# Patient Record
Sex: Female | Born: 1995 | Race: White | Hispanic: No | Marital: Married | Smoking: Never smoker
Health system: Southern US, Community
[De-identification: ages and names within clinical notes are randomized; demographics above are authoritative.]

## PROBLEM LIST (undated history)

## (undated) DIAGNOSIS — L709 Acne, unspecified: Secondary | ICD-10-CM

## (undated) DIAGNOSIS — I10 Essential (primary) hypertension: Secondary | ICD-10-CM

## (undated) DIAGNOSIS — Z789 Other specified health status: Secondary | ICD-10-CM

## (undated) HISTORY — PX: APPENDECTOMY: SHX54

## (undated) HISTORY — PX: NO PAST SURGERIES: SHX2092

## (undated) HISTORY — DX: Essential (primary) hypertension: I10

---
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
## 2016-04-17 ENCOUNTER — Encounter: Payer: Self-pay | Admitting: Emergency Medicine

## 2016-04-17 ENCOUNTER — Ambulatory Visit
Admission: EM | Admit: 2016-04-17 | Discharge: 2016-04-17 | Disposition: A | Discharge status: Home / Self Care | Payer: BLUE CROSS/BLUE SHIELD | Attending: Emergency Medicine | Admitting: Emergency Medicine

## 2016-04-17 DIAGNOSIS — M62830 Muscle spasm of back: Secondary | ICD-10-CM | POA: Diagnosis not present

## 2016-04-17 DIAGNOSIS — S29012A Strain of muscle and tendon of back wall of thorax, initial encounter: Secondary | ICD-10-CM | POA: Diagnosis not present

## 2016-04-17 DIAGNOSIS — S46811A Strain of other muscles, fascia and tendons at shoulder and upper arm level, right arm, initial encounter: Secondary | ICD-10-CM

## 2016-04-17 MED ORDER — METAXALONE 800 MG PO TABS: 800.0000 mg | ORAL_TABLET | Freq: Three times a day (TID) | ORAL | Status: DC

## 2016-04-17 MED ORDER — DICLOFENAC SODIUM 75 MG PO TBEC: 75.0000 mg | DELAYED_RELEASE_TABLET | Freq: Two times a day (BID) | ORAL | Status: DC

## 2016-04-17 MED ORDER — TRAMADOL HCL 50 MG PO TABS: ORAL_TABLET | ORAL | Status: DC

## 2016-04-17 NOTE — ED Provider Notes (Signed)
HPI  SUBJECTIVE:  Gabrielle Fernandez is a 20 y.o. female who presents with 1-1/2 weeks of right upper back pain that she describes as constant, sharp, sore. It is located between her right scapula, spine and trapezius area. She denies any radiation to his pain but reports some numbness in her right arm if she holds it up. It resolves on its own when she lowers her arm. She tried 2 tabs of Tylenol once a day for 3 days. She has not taken any NSAIDs. She is currently not on any narcotics. There are no alleviating factors. Symptoms worse with pulling, lifting her arm, and lifting heavy objects, lying down, torso rotation. She is right-handed. 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 She reports a lot of heavy lifting, repetitive motion. She states that she is working more recently. She denies any direct trauma to her arm, back, shoulder. No fevers, coughing, wheezing, chest pain, shortness of breath. No shoulder joint pain. No bruising, erythema, swelling. No arm weakness. No neck pain. No abdominal pain. She has never had symptoms like this before. Past medical history negative for diabetes, hypertension, asthma, emphysema, COPD, cancer, osteoporosis, prolonged steroid use, previous back injury, shoulder injury, IV drug use, coronary artery disease, MI, pneumothorax. LMP: 3 weeks ago, she denies possibility of being pregnant, states we do not need to check. PMD: Dr. Quillian QuinceBliss.    History reviewed. No pertinent past medical history.  History reviewed. No pertinent past surgical history.  History reviewed. No pertinent family history.  Social History  Substance Use Topics  . Smoking status: Never Smoker   . Smokeless tobacco: None  . Alcohol Use: No    No current facility-administered medications for this encounter.  Current outpatient prescriptions:  .  diclofenac (VOLTAREN) 75 MG EC tablet, Take 1 tablet (75 mg total) by mouth 2 (two) times daily.  Take with food, Disp: 30 tablet, Rfl: 0 .  metaxalone (SKELAXIN) 800 MG tablet, Take 1 tablet (800 mg total) by mouth 3 (three) times daily., Disp: 21 tablet, Rfl: 0 .  traMADol (ULTRAM) 50 MG tablet, 1-2 tabs po q 6 hr prn pain Maximum dose= 8 tablets per day, Disp: 20 tablet, Rfl: 0  No Known Allergies   ROS  As noted in HPI.   Physical Exam  BP 128/86 mmHg  Pulse 85  Temp(Src) 98.7 F (37.1 C) (Oral)  Resp 16  Ht 5\' 6"  (1.676 m)  Wt 130 lb (58.968 kg)  BMI 20.99 kg/m2  SpO2 100%  LMP 03/27/2016 (Approximate)  Constitutional: Well developed, well nourished, no acute distress Eyes:  EOMI, conjunctiva normal bilaterally HENT: Normocephalic, atraumatic,mucus membranes moist Respiratory: Normal inspiratory effort , Lungs clear bilaterally, good air movement Cardiovascular: Normal rate, regular rhythm, no murmurs, rubs, gallops GI: nondistended.  skin: No rash , Bruising, color changes on her back, skin intact Musculoskeletal: Positive tenderness along the right trapezius and rhomboid muscles. Positive  muscle spasm. Symptoms aggravated with arm movement, torso rotation, rowing motion. No shoulder joint tenderness. No bony tenderness. No tenderness along her right upper extremity. Back pain is aggravated with abduction, forward flexion, lift off test. RP 2+ equal bilaterally Spine: No C-spine, T-spine, L-spine tenderness. No paralumbar tenderness, muscle spasm. Neurologic: Alert & oriented x 3, no focal neuro deficits. Arm, shoulder, grip strength equal bilaterally. Sensation grossly intact. Psychiatric: Speech and behavior appropriate   ED Course   Medications - No data to display  No  orders of the defined types were placed in this encounter.    No results found for this or any previous visit (from the past 24 hour(s)). No results found.  ED Clinical Impression  Rhomboid muscle strain, initial encounter  Back muscle spasm  Trapezius strain, right, initial  encounter   ED Assessment/Plan  [place] narcotic database reviewed. Pt with no narcotic rx in the past 6 months.   Presentation most consistent with trapezius/rhomboid strain/sprain with some muscle spasm. Doubt bony injury, or spinal cord involvement, deferring imaging. She has no evidence of neurovascular compromise. She denies any neck pain, unsure as to the etiology of her paresthesias when she raises her arm. there may be some shoulder impingement, however, it seems to be primarily muscular strain and spasm. We'll send home with regular NSAIDs, diclofenac 75 mg twice a day, muscle relaxant, tramadol, advised deep tissue massage. We'll write work 2 day work note. Follow-up with PMD as needed. Discussed signs and symptoms that should prompt return to emergency room. Patient agrees with plan.  *This clinic note was created using Dragon dictation software. Therefore, there may be occasional mistakes despite careful proofreading.  ?    Domenick Gong, MD 04/17/16 2052

## 2016-04-17 NOTE — ED Notes (Signed)
Patient c/o upper right sided back pain for the past week.  Patient reports that she does some lifting at work.

## 2016-08-31 ENCOUNTER — Encounter: Payer: Self-pay | Admitting: Emergency Medicine

## 2016-08-31 DIAGNOSIS — N132 Hydronephrosis with renal and ureteral calculous obstruction: Secondary | ICD-10-CM | POA: Insufficient documentation

## 2016-08-31 DIAGNOSIS — K353 Acute appendicitis with localized peritonitis: Principal | ICD-10-CM | POA: Insufficient documentation

## 2016-08-31 LAB — POCT PREGNANCY, URINE: Preg Test, Ur: NEGATIVE

## 2016-08-31 NOTE — ED Triage Notes (Signed)
Pt presents to ED with sudden onset of right lower abd pain. Pt states her abd pain initially started as more generalized and more recently has moved to her right lower quadrant. Pt bent over during triage with her hand over her abd. Pt alert and answering questions without difficulty. Pt reports pain increases with movement. +nasuea.

## 2016-09-01 ENCOUNTER — Observation Stay: Payer: BLUE CROSS/BLUE SHIELD | Admitting: Anesthesiology

## 2016-09-01 ENCOUNTER — Observation Stay
Admission: EM | Admit: 2016-09-01 | Discharge: 2016-09-02 | Disposition: A | Discharge status: Home / Self Care | Payer: BLUE CROSS/BLUE SHIELD | Attending: Surgery | Admitting: Surgery

## 2016-09-01 ENCOUNTER — Emergency Department: Payer: BLUE CROSS/BLUE SHIELD

## 2016-09-01 ENCOUNTER — Encounter
Admission: EM | Disposition: A | Discharge status: Home / Self Care | Payer: Self-pay | Source: Home / Self Care | Attending: Emergency Medicine

## 2016-09-01 DIAGNOSIS — R109 Unspecified abdominal pain: Secondary | ICD-10-CM

## 2016-09-01 DIAGNOSIS — K353 Acute appendicitis with localized peritonitis, without perforation or gangrene: Secondary | ICD-10-CM

## 2016-09-01 DIAGNOSIS — K3580 Unspecified acute appendicitis: Secondary | ICD-10-CM | POA: Diagnosis present

## 2016-09-01 HISTORY — PX: LAPAROSCOPIC APPENDECTOMY: SHX408

## 2016-09-01 HISTORY — DX: Other specified health status: Z78.9

## 2016-09-01 LAB — COMPREHENSIVE METABOLIC PANEL
ALBUMIN: 4.5 g/dL (ref 3.5–5.0)
ALK PHOS: 34 U/L — AB (ref 38–126)
ALT: 18 U/L (ref 14–54)
ANION GAP: 6 (ref 5–15)
AST: 18 U/L (ref 15–41)
BILIRUBIN TOTAL: 0.4 mg/dL (ref 0.3–1.2)
BUN: 12 mg/dL (ref 6–20)
CALCIUM: 9.9 mg/dL (ref 8.9–10.3)
CO2: 26 mmol/L (ref 22–32)
Chloride: 106 mmol/L (ref 101–111)
Creatinine, Ser: 0.64 mg/dL (ref 0.44–1.00)
GFR calc Af Amer: >60 mL/min (ref >60)
GLUCOSE: 84 mg/dL (ref 65–99)
POTASSIUM: 3.5 mmol/L (ref 3.5–5.1)
Sodium: 138 mmol/L (ref 135–145)
TOTAL PROTEIN: 7.6 g/dL (ref 6.5–8.1)

## 2016-09-01 LAB — URINALYSIS COMPLETE WITH MICROSCOPIC (ARMC ONLY)
BILIRUBIN URINE: NEGATIVE
Bacteria, UA: NONE SEEN
GLUCOSE, UA: NEGATIVE mg/dL
Ketones, ur: NEGATIVE mg/dL
LEUKOCYTES UA: NEGATIVE
NITRITE: NEGATIVE
PH: 6 (ref 5.0–8.0)
Protein, ur: NEGATIVE mg/dL
SPECIFIC GRAVITY, URINE: 1.009 (ref 1.005–1.030)

## 2016-09-01 LAB — CBC
HEMATOCRIT: 40.3 % (ref 35.0–47.0)
HEMOGLOBIN: 14.1 g/dL (ref 12.0–16.0)
MCH: 31.1 pg (ref 26.0–34.0)
MCHC: 34.9 g/dL (ref 32.0–36.0)
MCV: 89 fL (ref 80.0–100.0)
Platelets: 190 10*3/uL (ref 150–440)
RBC: 4.53 MIL/uL (ref 3.80–5.20)
RDW: 12.2 % (ref 11.5–14.5)
WBC: 9.2 10*3/uL (ref 3.6–11.0)

## 2016-09-01 SURGERY — APPENDECTOMY, LAPAROSCOPIC
Anesthesia: General | Site: Abdomen | Wound class: Clean Contaminated

## 2016-09-01 MED ORDER — ONDANSETRON HCL 4 MG/2ML IJ SOLN
4.0000 mg | Freq: Four times a day (QID) | INTRAMUSCULAR | Status: DC | PRN
  Administered 2016-09-01 (×2): 4 mg via INTRAVENOUS
  Filled 2016-09-01: qty 2

## 2016-09-01 MED ORDER — FENTANYL CITRATE (PF) 100 MCG/2ML IJ SOLN
INTRAMUSCULAR | Status: DC | PRN
Start: 2016-09-01 — End: 2016-09-01
  Administered 2016-09-01 (×4): 50 ug via INTRAVENOUS

## 2016-09-01 MED ORDER — ROCURONIUM BROMIDE 100 MG/10ML IV SOLN
INTRAVENOUS | Status: DC | PRN
  Administered 2016-09-01: 40 mg via INTRAVENOUS

## 2016-09-01 MED ORDER — OXYCODONE HCL 5 MG PO TABS: 5.0000 mg | ORAL_TABLET | Freq: Once | ORAL | Status: DC | PRN

## 2016-09-01 MED ORDER — ACETAMINOPHEN 500 MG PO TABS
1000.0000 mg | ORAL_TABLET | Freq: Four times a day (QID) | ORAL | Status: DC
  Administered 2016-09-01 – 2016-09-02 (×5): 1000 mg via ORAL
  Filled 2016-09-01 (×6): qty 2

## 2016-09-01 MED ORDER — PROMETHAZINE HCL 25 MG/ML IJ SOLN: 6.2500 mg | INTRAMUSCULAR | Status: DC | PRN

## 2016-09-01 MED ORDER — FENTANYL CITRATE (PF) 100 MCG/2ML IJ SOLN: 25.0000 ug | INTRAMUSCULAR | Status: DC | PRN

## 2016-09-01 MED ORDER — POLYETHYLENE GLYCOL 3350 17 G PO PACK
17.0000 g | PACK | Freq: Every day | ORAL | Status: DC | PRN
  Filled 2016-09-01: qty 1

## 2016-09-01 MED ORDER — MEPERIDINE HCL 25 MG/ML IJ SOLN: 6.2500 mg | INTRAMUSCULAR | Status: DC | PRN

## 2016-09-01 MED ORDER — PIPERACILLIN-TAZOBACTAM 3.375 G IVPB
3.3750 g | Freq: Three times a day (TID) | INTRAVENOUS | Status: DC
  Administered 2016-09-01: 3.375 g via INTRAVENOUS
  Filled 2016-09-01 (×4): qty 50

## 2016-09-01 MED ORDER — PIPERACILLIN-TAZOBACTAM 3.375 G IVPB: 3.3750 g | Freq: Three times a day (TID) | INTRAVENOUS | Status: DC

## 2016-09-01 MED ORDER — MORPHINE SULFATE (PF) 4 MG/ML IV SOLN
4.0000 mg | Freq: Once | INTRAVENOUS | Status: AC
  Administered 2016-09-01: 4 mg via INTRAVENOUS
  Filled 2016-09-01: qty 1

## 2016-09-01 MED ORDER — BUPIVACAINE-EPINEPHRINE (PF) 0.5% -1:200000 IJ SOLN
INTRAMUSCULAR | Status: DC | PRN
  Administered 2016-09-01: 30 mL via PERINEURAL

## 2016-09-01 MED ORDER — SUGAMMADEX SODIUM 200 MG/2ML IV SOLN
INTRAVENOUS | Status: DC | PRN
  Administered 2016-09-01: 127 mg via INTRAVENOUS

## 2016-09-01 MED ORDER — KETOROLAC TROMETHAMINE 30 MG/ML IJ SOLN
30.0000 mg | Freq: Four times a day (QID) | INTRAMUSCULAR | Status: DC
  Administered 2016-09-01 – 2016-09-02 (×3): 30 mg via INTRAVENOUS
  Filled 2016-09-01 (×3): qty 1

## 2016-09-01 MED ORDER — ONDANSETRON HCL 4 MG/2ML IJ SOLN
4.0000 mg | Freq: Once | INTRAMUSCULAR | Status: AC
  Administered 2016-09-01: 4 mg via INTRAVENOUS
  Filled 2016-09-01: qty 2

## 2016-09-01 MED ORDER — LACTATED RINGERS IV SOLN
INTRAVENOUS | Status: DC | PRN
  Administered 2016-09-01: 15:00:00 via INTRAVENOUS

## 2016-09-01 MED ORDER — DEXAMETHASONE SODIUM PHOSPHATE 10 MG/ML IJ SOLN
INTRAMUSCULAR | Status: DC | PRN
  Administered 2016-09-01: 8 mg via INTRAVENOUS

## 2016-09-01 MED ORDER — BUPIVACAINE-EPINEPHRINE (PF) 0.5% -1:200000 IJ SOLN
INTRAMUSCULAR | Status: AC
  Filled 2016-09-01: qty 30

## 2016-09-01 MED ORDER — IOPAMIDOL (ISOVUE-300) INJECTION 61%
100.0000 mL | Freq: Once | INTRAVENOUS | Status: AC | PRN
  Administered 2016-09-01: 100 mL via INTRAVENOUS

## 2016-09-01 MED ORDER — SODIUM CHLORIDE 0.9 % IV BOLUS (SEPSIS)
1000.0000 mL | Freq: Once | INTRAVENOUS | Status: AC
  Administered 2016-09-01: 1000 mL via INTRAVENOUS

## 2016-09-01 MED ORDER — HYDROMORPHONE HCL 1 MG/ML IJ SOLN
0.5000 mg | INTRAMUSCULAR | Status: DC | PRN
  Administered 2016-09-01: 0.5 mg via INTRAVENOUS
  Filled 2016-09-01: qty 1

## 2016-09-01 MED ORDER — KETOROLAC TROMETHAMINE 30 MG/ML IJ SOLN
30.0000 mg | Freq: Four times a day (QID) | INTRAMUSCULAR | Status: DC
  Administered 2016-09-01 (×2): 30 mg via INTRAVENOUS
  Filled 2016-09-01: qty 1

## 2016-09-01 MED ORDER — IOPAMIDOL (ISOVUE-300) INJECTION 61%
30.0000 mL | Freq: Once | INTRAVENOUS | Status: AC | PRN
  Administered 2016-09-01: 30 mL via ORAL

## 2016-09-01 MED ORDER — ONDANSETRON 4 MG PO TBDP
4.0000 mg | ORAL_TABLET | Freq: Four times a day (QID) | ORAL | Status: DC | PRN
Start: 2016-09-01 — End: 2016-09-02
  Administered 2016-09-01 – 2016-09-02 (×2): 4 mg via ORAL
  Filled 2016-09-01 (×3): qty 1

## 2016-09-01 MED ORDER — OXYCODONE HCL 5 MG/5ML PO SOLN: 5.0000 mg | Freq: Once | ORAL | Status: DC | PRN

## 2016-09-01 MED ORDER — MIDAZOLAM HCL 2 MG/2ML IJ SOLN
INTRAMUSCULAR | Status: DC | PRN
  Administered 2016-09-01: 2 mg via INTRAVENOUS

## 2016-09-01 MED ORDER — LIDOCAINE HCL (CARDIAC) 20 MG/ML IV SOLN
INTRAVENOUS | Status: DC | PRN
  Administered 2016-09-01: 40 mg via INTRAVENOUS

## 2016-09-01 MED ORDER — SODIUM CHLORIDE 0.9 % IV SOLN
INTRAVENOUS | Status: DC
  Administered 2016-09-01 (×2): via INTRAVENOUS

## 2016-09-01 MED ORDER — PROPOFOL 10 MG/ML IV BOLUS
INTRAVENOUS | Status: DC | PRN
  Administered 2016-09-01: 130 mg via INTRAVENOUS

## 2016-09-01 MED ORDER — KETOROLAC TROMETHAMINE 30 MG/ML IJ SOLN
INTRAMUSCULAR | Status: AC
  Filled 2016-09-01: qty 1

## 2016-09-01 MED ORDER — OXYCODONE HCL 5 MG PO TABS
5.0000 mg | ORAL_TABLET | ORAL | Status: DC | PRN
  Administered 2016-09-01 (×2): 5 mg via ORAL
  Administered 2016-09-02 (×2): 10 mg via ORAL
  Filled 2016-09-01 (×2): qty 1
  Filled 2016-09-01 (×2): qty 2

## 2016-09-01 MED ORDER — PANTOPRAZOLE SODIUM 40 MG PO TBEC
40.0000 mg | DELAYED_RELEASE_TABLET | Freq: Every day | ORAL | Status: DC
  Administered 2016-09-01 – 2016-09-02 (×2): 40 mg via ORAL
  Filled 2016-09-01 (×2): qty 1

## 2016-09-01 SURGICAL SUPPLY — 41 items
CANISTER SUCT 1200ML W/VALVE (MISCELLANEOUS) ×3 IMPLANT
CHLORAPREP W/TINT 26ML (MISCELLANEOUS) ×3 IMPLANT
CUTTER FLEX LINEAR 45M (STAPLE) ×3 IMPLANT
DERMABOND ADVANCED (GAUZE/BANDAGES/DRESSINGS) ×2
DERMABOND ADVANCED .7 DNX12 (GAUZE/BANDAGES/DRESSINGS) ×1 IMPLANT
ELECT CAUTERY BLADE 6.4 (BLADE) ×3 IMPLANT
ELECT REM PT RETURN 9FT ADLT (ELECTROSURGICAL) ×3
ELECTRODE REM PT RTRN 9FT ADLT (ELECTROSURGICAL) ×1 IMPLANT
ENDOPOUCH RETRIEVER 10 (MISCELLANEOUS) ×3 IMPLANT
GLOVE SURG SYN 7.0 (GLOVE) ×3 IMPLANT
GLOVE SURG SYN 7.5  E (GLOVE) ×2
GLOVE SURG SYN 7.5 E (GLOVE) ×1 IMPLANT
GOWN STRL REUS W/ TWL LRG LVL3 (GOWN DISPOSABLE) ×2 IMPLANT
GOWN STRL REUS W/TWL LRG LVL3 (GOWN DISPOSABLE) ×4
IRRIGATION STRYKERFLOW (MISCELLANEOUS) ×1 IMPLANT
IRRIGATOR STRYKERFLOW (MISCELLANEOUS) ×3
IV NS 1000ML (IV SOLUTION) ×2
IV NS 1000ML BAXH (IV SOLUTION) ×1 IMPLANT
KIT RM TURNOVER STRD PROC AR (KITS) ×3 IMPLANT
LABEL OR SOLS (LABEL) ×3 IMPLANT
LIGASURE MARYLAND LAP STAND (ELECTROSURGICAL) ×3 IMPLANT
NDL HPO THNWL 1X22GA REG BVL (NEEDLE) ×1 IMPLANT
NEEDLE SAFETY 22GX1 (NEEDLE) ×2
NS IRRIG 500ML POUR BTL (IV SOLUTION) ×3 IMPLANT
PACK LAP CHOLECYSTECTOMY (MISCELLANEOUS) ×3 IMPLANT
PENCIL ELECTRO HAND CTR (MISCELLANEOUS) ×3 IMPLANT
RELOAD 45 VASCULAR/THIN (ENDOMECHANICALS) IMPLANT
RELOAD STAPLE TA45 3.5 REG BLU (ENDOMECHANICALS) ×3 IMPLANT
SCISSORS METZENBAUM CVD 33 (INSTRUMENTS) ×3 IMPLANT
SLEEVE ADV FIXATION 5X100MM (TROCAR) ×6 IMPLANT
SUT MNCRL 4-0 (SUTURE) ×4
SUT MNCRL 4-0 27XMFL (SUTURE) ×2
SUT VIC AB 3-0 SH 27 (SUTURE) ×2
SUT VIC AB 3-0 SH 27X BRD (SUTURE) ×1 IMPLANT
SUT VICRYL 0 AB UR-6 (SUTURE) ×3 IMPLANT
SUTURE MNCRL 4-0 27XMF (SUTURE) ×2 IMPLANT
TRAY FOLEY W/METER SILVER 16FR (SET/KITS/TRAYS/PACK) ×3 IMPLANT
TROCAR 130MM GELPORT  DAV (MISCELLANEOUS) IMPLANT
TROCAR XCEL BLUNT TIP 100MML (ENDOMECHANICALS) ×3 IMPLANT
TROCAR Z-THREAD OPTICAL 5X100M (TROCAR) ×3 IMPLANT
TUBING INSUFFLATOR HI FLOW (MISCELLANEOUS) ×3 IMPLANT

## 2016-09-01 NOTE — Anesthesia Procedure Notes (Signed)
Procedure Name: Intubation Date/Time: 09/01/2016 3:07 PM Performed by: Henrietta HooverPOPE, Lakie Mclouth Pre-anesthesia Checklist: Emergency Drugs available, Patient identified, Patient being monitored, Timeout performed and Suction available Patient Re-evaluated:Patient Re-evaluated prior to inductionOxygen Delivery Method: Circle system utilized Preoxygenation: Pre-oxygenation with 100% oxygen Intubation Type: IV induction Ventilation: Mask ventilation without difficulty Laryngoscope Size: Mac and 3 Grade View: Grade I Tube type: Oral Tube size: 7.0 mm Number of attempts: 1 Airway Equipment and Method: Stylet Placement Confirmation: ETT inserted through vocal cords under direct vision,  positive ETCO2 and breath sounds checked- equal and bilateral Secured at: 22 cm Tube secured with: Tape Dental Injury: Teeth and Oropharynx as per pre-operative assessment

## 2016-09-01 NOTE — Anesthesia Postprocedure Evaluation (Signed)
Anesthesia Post Note  Patient: Gabrielle Fernandez  Procedure(s) Performed: Procedure(s) (LRB): APPENDECTOMY LAPAROSCOPIC (N/A)  Patient location during evaluation: PACU Anesthesia Type: General Level of consciousness: awake and alert Pain management: pain level controlled Vital Signs Assessment: post-procedure vital signs reviewed and stable Respiratory status: spontaneous breathing and respiratory function stable Cardiovascular status: blood pressure returned to baseline and stable Anesthetic complications: no    Last Vitals:  Vitals:   09/01/16 1629 09/01/16 1643  BP: (!) 114/58 (!) 109/54  Pulse: 88 79  Resp: 19 16  Temp: 36.6 C     Last Pain:  Vitals:   09/01/16 1643  TempSrc:   PainSc: 0-No pain                 Erielle Gawronski K

## 2016-09-01 NOTE — H&P (Signed)
Date of Admission:  09/01/2016  Reason for Admission:  Acute appendicitis  History of Present Illness: Gabrielle Fernandez is a 20 y.o. female who presents with a one-day history of abdominal pain. Patient reports her pain started yesterday at around noontime in the mid abdominal region but has now radiated towards the right lower quadrant and is sharp and has worsened in severity. Patient denies any fevers but does report having chills at home. She denies having chest pain or shortness of breath. She does report having nausea and episodes of emesis this morning. Denies other areas of abdominal pain. Denies constipation or diarrhea. Denies any hematuria or dysuria.  Past Medical History: History reviewed. No pertinent past medical history.   Past Surgical History: History reviewed. No pertinent surgical history.  Home Medications: Prior to Admission medications   Medication Sig Start Date End Date Taking? Authorizing Provider  diclofenac (VOLTAREN) 75 MG EC tablet Take 1 tablet (75 mg total) by mouth 2 (two) times daily. Take with food Patient not taking: Reported on 09/01/2016 04/17/16   Domenick Gong, MD  metaxalone (SKELAXIN) 800 MG tablet Take 1 tablet (800 mg total) by mouth 3 (three) times daily. Patient not taking: Reported on 09/01/2016 04/17/16   Domenick Gong, MD    Allergies: No Known Allergies  Social History:  reports that she has never smoked. She has never used smokeless tobacco. She reports that she drinks alcohol. Her drug history is not on file.   Family History: No family history on file.  Review of Systems: Review of Systems  Constitutional: Positive for chills. Negative for fever.  HENT: Negative for hearing loss.   Eyes: Negative for blurred vision.  Respiratory: Negative for cough and shortness of breath.   Cardiovascular: Negative for chest pain and leg swelling.  Gastrointestinal: Positive for abdominal pain, nausea and vomiting. Negative for blood  in stool, constipation, diarrhea and heartburn.  Genitourinary: Negative for dysuria and hematuria.  Musculoskeletal: Negative for myalgias.  Skin: Negative for rash.  Neurological: Negative for dizziness.  Psychiatric/Behavioral: Negative for depression.  All other systems reviewed and are negative.   Physical Exam BP (!) 91/57 (BP Location: Left Arm)   Pulse 87   Temp 99 F (37.2 C) (Oral)   Resp 20   Ht 5\' 7"  (1.702 m)   Wt 63.5 kg (140 lb)   LMP 08/06/2016 (Within Days)   SpO2 100%   BMI 21.93 kg/m  CONSTITUTIONAL: No acute distress HEENT:  Normocephalic, atraumatic, extraocular motion intact. NECK: Trachea is midline, and there is no jugular venous distension. RESPIRATORY:  Lungs are clear, and breath sounds are equal bilaterally. Normal respiratory effort without pathologic use of accessory muscles. CARDIOVASCULAR: Heart is regular without murmurs, gallops, or rubs. GI: The abdomen is soft, nondistended, tender to palpation in the right lower quadrant at McBurney's point. Positive Rovsing sign. There were no palpable masses.  MUSCULOSKELETAL:  Normal muscle strength and tone in all four extremities.  No peripheral edema or cyanosis. SKIN: Skin turgor is normal. There are no pathologic skin lesions.  NEUROLOGIC:  Motor and sensation is grossly normal.  Cranial nerves are grossly intact. PSYCH:  Alert and oriented to person, place and time. Affect is normal.  Laboratory Analysis: Results for orders placed or performed during the hospital encounter of 09/01/16 (from the past 24 hour(s))  Comprehensive metabolic panel     Status: Abnormal   Collection Time: 08/31/16 11:37 PM  Result Value Ref Range   Sodium 138 135 - 145  mmol/L   Potassium 3.5 3.5 - 5.1 mmol/L   Chloride 106 101 - 111 mmol/L   CO2 26 22 - 32 mmol/L   Glucose, Bld 84 65 - 99 mg/dL   BUN 12 6 - 20 mg/dL   Creatinine, Ser 1.610.64 0.44 - 1.00 mg/dL   Calcium 9.9 8.9 - 09.610.3 mg/dL   Total Protein 7.6 6.5 - 8.1  g/dL   Albumin 4.5 3.5 - 5.0 g/dL   AST 18 15 - 41 U/L   ALT 18 14 - 54 U/L   Alkaline Phosphatase 34 (L) 38 - 126 U/L   Total Bilirubin 0.4 0.3 - 1.2 mg/dL   GFR calc non Af Amer >60 >60 mL/min   GFR calc Af Amer >60 >60 mL/min   Anion gap 6 5 - 15  CBC     Status: None   Collection Time: 08/31/16 11:37 PM  Result Value Ref Range   WBC 9.2 3.6 - 11.0 K/uL   RBC 4.53 3.80 - 5.20 MIL/uL   Hemoglobin 14.1 12.0 - 16.0 g/dL   HCT 04.540.3 40.935.0 - 81.147.0 %   MCV 89.0 80.0 - 100.0 fL   MCH 31.1 26.0 - 34.0 pg   MCHC 34.9 32.0 - 36.0 g/dL   RDW 91.412.2 78.211.5 - 95.614.5 %   Platelets 190 150 - 440 K/uL  Urinalysis complete, with microscopic     Status: Abnormal   Collection Time: 08/31/16 11:37 PM  Result Value Ref Range   Color, Urine STRAW (A) YELLOW   APPearance HAZY (A) CLEAR   Glucose, UA NEGATIVE NEGATIVE mg/dL   Bilirubin Urine NEGATIVE NEGATIVE   Ketones, ur NEGATIVE NEGATIVE mg/dL   Specific Gravity, Urine 1.009 1.005 - 1.030   Hgb urine dipstick 1+ (A) NEGATIVE   pH 6.0 5.0 - 8.0   Protein, ur NEGATIVE NEGATIVE mg/dL   Nitrite NEGATIVE NEGATIVE   Leukocytes, UA NEGATIVE NEGATIVE   RBC / HPF 0-5 0 - 5 RBC/hpf   WBC, UA 0-5 0 - 5 WBC/hpf   Bacteria, UA NONE SEEN NONE SEEN   Squamous Epithelial / LPF 0-5 (A) NONE SEEN  Pregnancy, urine POC     Status: None   Collection Time: 08/31/16 11:42 PM  Result Value Ref Range   Preg Test, Ur NEGATIVE NEGATIVE    Imaging: [place] Pelvis Complete  Result Date: 09/01/2016 CLINICAL DATA:  Right lower quadrant pain. History of ovarian cysts. EXAM: TRANSABDOMINAL ULTRASOUND OF PELVIS DOPPLER ULTRASOUND OF OVARIES TECHNIQUE: Transabdominal ultrasound examination of the pelvis was performed including evaluation of the uterus, ovaries, adnexal regions, and pelvic cul-de-sac. Color and duplex Doppler ultrasound was utilized to evaluate blood flow to the ovaries. COMPARISON:  Ultrasound pelvis 12/26/2012 FINDINGS: Uterus Measurements: 5.9 x 3.7 x 4.1 cm.  Uterus is anteverted. No fibroids or other mass visualized. Endometrium Thickness: 8.6 mm. No focal abnormality visualized. Right ovary Measurements: 2.5 x 2 x 2.4 cm. Normal appearance/no adnexal mass. Left ovary Measurements: 3.6 x 2.5 x 2.6 cm. Normal appearance/no adnexal mass. Pulsed Doppler evaluation demonstrates normal low-resistance arterial and venous waveforms in both ovaries. Flow is demonstrated in both ovaries on color flow Doppler imaging. No free fluid in the pelvis. IMPRESSION: Normal ultrasound appearance of the uterus and ovaries. No evidence of adnexal mass or ovarian torsion. Electronically Signed   By: Burman NievesWilliam  Stevens M.D.   On: 09/01/2016 05:58   Ct Abdomen Pelvis W Contrast  Result Date: 09/01/2016 CLINICAL DATA:  Sudden onset right lower abdominal pain. Pain  increases with movement. Nausea. EXAM: CT ABDOMEN AND PELVIS WITH CONTRAST TECHNIQUE: Multidetector CT imaging of the abdomen and pelvis was performed using the standard protocol following bolus administration of intravenous contrast. CONTRAST:  ISOVUE-300 IOPAMIDOL (ISOVUE-300) INJECTION 61% COMPARISON:  Ultrasound pelvis 09/01/2016 FINDINGS: Lower chest: Lung bases are clear. Hepatobiliary: No focal liver abnormality is seen. No gallstones, gallbladder wall thickening, or biliary dilatation. Pancreas: Unremarkable. No pancreatic ductal dilatation or surrounding inflammatory changes. Spleen: Normal in size without focal abnormality. Adrenals/Urinary Tract: Adrenal glands are unremarkable. 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 Mild left ureterectasis may indicate reflux. No hydronephrosis. Punctate size nonobstructing stone in a lower pole calyx on the left. No ureteral stones identified. Right kidney and ureter are unremarkable in appearance. Bladder is unremarkable. Stomach/Bowel: Stomach, small bowel, and colon are not abnormally distended. No wall thickening is identified. Scattered stool throughout the colon.  The appendix retrocecal in location and is distended with diameter measuring 13 mm. Periappendiceal edema and stranding. Changes are consistent with acute appendicitis. No abscess. Small amount of free fluid in the pelvis could be reactive or physiologic. Vascular/Lymphatic: No significant vascular findings are present. No enlarged abdominal or pelvic lymph nodes. Reproductive: Uterus and ovaries are not enlarged. Left fallopian tubes are somewhat prominent and fluid-filled suggesting mild hydrosalpinx. Prominent pelvic venous structures may indicate pelvic congestion. Involuting follicle demonstrated on the left ovary. Other: No free air in the abdomen. Abdominal wall musculature appears intact. Musculoskeletal: No acute or significant osseous findings. IMPRESSION: The retrocecal appendix is dilated with periappendiceal edema and fluid consistent with acute appendicitis. No abscess. Involuting follicle in the left ovary. Small amount of free fluid in the pelvis may be physiologic or reactive. Prominent pelvic veins may indicate pelvic congestion. 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 Mild ureteral dilatation on the left may indicate reflux. No hydronephrosis. Nonobstructing stone in the left kidney. Electronically Signed   By: Burman Nieves M.D.   On: 09/01/2016 06:39   [place] Art/ven Flow Abd Pelv Doppler  Result Date: 09/01/2016 CLINICAL DATA:  Right lower quadrant pain. History of ovarian cysts. EXAM: TRANSABDOMINAL ULTRASOUND OF PELVIS DOPPLER ULTRASOUND OF OVARIES TECHNIQUE: Transabdominal ultrasound examination of the pelvis was performed including evaluation of the uterus, ovaries, adnexal regions, and pelvic cul-de-sac. Color and duplex Doppler ultrasound was utilized to evaluate blood flow to the ovaries. COMPARISON:  Ultrasound pelvis 12/26/2012 FINDINGS: Uterus Measurements: 5.9 x 3.7 x 4.1 cm. Uterus is anteverted. No fibroids or other mass visualized. Endometrium Thickness: 8.6 mm. No  focal abnormality visualized. Right ovary Measurements: 2.5 x 2 x 2.4 cm. Normal appearance/no adnexal mass. Left ovary Measurements: 3.6 x 2.5 x 2.6 cm. Normal appearance/no adnexal mass. Pulsed Doppler evaluation demonstrates normal low-resistance arterial and venous waveforms in both ovaries. Flow is demonstrated in both ovaries on color flow Doppler imaging. No free fluid in the pelvis. IMPRESSION: Normal ultrasound appearance of the uterus and ovaries. No evidence of adnexal mass or ovarian torsion. Electronically Signed   By: Burman Nieves M.D.   On: 09/01/2016 05:58    Assessment and Plan: This is a 20 y.o. female who presents with acute appendicitis.  Patient will be admitted to the general surgery service. We have discussed the management options including conservative management with antibiotics versus surgical management the patient has opted for appendectomy. The risks and benefits of a laparoscopic procedure have been explained to the patient including risk of bleeding, infection, and injury to surrounding structures, including the possibility for open procedure, and she  has given informed consent. The patient will be nothing by mouth with IV fluid hydration and will be started on IV antibiotics in the emergency room. She will be posted for laparoscopic appendectomy to be done today. The patient understands this plan and all of her questions and her parents' questions have been answered.   Howie IllJose Luis Sherley Mckenney, MD [facility]

## 2016-09-01 NOTE — ED Notes (Signed)
Patient at US/CT

## 2016-09-01 NOTE — Anesthesia Preprocedure Evaluation (Signed)
Anesthesia Evaluation  Patient identified by MRN, date of birth, ID band Patient awake    Reviewed: Allergy & Precautions, NPO status , Patient's Chart, lab work & pertinent test results  History of Anesthesia Complications Negative for: history of anesthetic complications  Airway Mallampati: II  TM Distance: >3 FB Neck ROM: Full    Dental no notable dental hx.    Pulmonary neg pulmonary ROS, neg sleep apnea, neg COPD,    breath sounds clear to auscultation- rhonchi (-) wheezing      Cardiovascular Exercise Tolerance: Good (-) hypertension(-) CAD and (-) Past MI  Rhythm:Regular Rate:Normal - Systolic murmurs and - Diastolic murmurs    Neuro/Psych negative neurological ROS  negative psych ROS   GI/Hepatic negative GI ROS, Neg liver ROS,   Endo/Other  negative endocrine ROSneg diabetes  Renal/GU negative Renal ROS     Musculoskeletal negative musculoskeletal ROS (+)   Abdominal (+) - obese,   Peds  Hematology negative hematology ROS (+)   Anesthesia Other Findings Acute appendicitis   Reproductive/Obstetrics                             Anesthesia Physical Anesthesia Plan  ASA: I  Anesthesia Plan: General   Post-op Pain Management:    Induction: Intravenous, Rapid sequence and Cricoid pressure planned  Airway Management Planned: Oral ETT  Additional Equipment:   Intra-op Plan:   Post-operative Plan: Extubation in OR  Informed Consent: I have reviewed the patients History and Physical, chart, labs and discussed the procedure including the risks, benefits and alternatives for the proposed anesthesia with the patient or authorized representative who has indicated his/her understanding and acceptance.   Dental advisory given  Plan Discussed with: CRNA and Anesthesiologist  Anesthesia Plan Comments:         Anesthesia Quick Evaluation

## 2016-09-01 NOTE — Transfer of Care (Signed)
Immediate Anesthesia Transfer of Care Note  Patient: Gabrielle Fernandez  Procedure(s) Performed: Procedure(s): APPENDECTOMY LAPAROSCOPIC (N/A)  Patient Location: PACU  Anesthesia Type:General  Level of Consciousness: awake  Airway & Oxygen Therapy: Patient Spontanous Breathing and Patient connected to face mask oxygen  Post-op Assessment: Report given to RN and Post -op Vital signs reviewed and stable  Post vital signs: Reviewed and stable  Last Vitals:  Vitals:   09/01/16 1306 09/01/16 1629  BP: 117/71 (!) 114/58  Pulse: 77 88  Resp: 16 19  Temp: 37 C 36.6 C    Last Pain:  Vitals:   09/01/16 1306  TempSrc: Tympanic  PainSc:          Complications: No apparent anesthesia complications

## 2016-09-01 NOTE — Op Note (Signed)
  Procedure Date:  09/01/2016  Pre-operative Diagnosis:  Acute appendicitis  Post-operative Diagnosis: Acute appendicitis  Procedure:  Laparoscopic appendectomy  Surgeon:  Howie IllJose Luis Estrella Alcaraz, MD  Anesthesia:  General endotracheal  Estimated Blood Loss:  5 ml  Specimens:  appendix  Complications:  None  Indications for Procedure:  This is a 20 y.o. female who presents with abdominal pain and workup revealing acute appendicitis.  The options of surgery versus observation were reviewed with the patient and/or family. The risks of bleeding, infection, recurrence of symptoms, negative laparoscopy, potential for an open procedure, bowel injury, abscess or infection, were all discussed with the patient and she was willing to proceed.  Description of Procedure: The patient was correctly identified in the preoperative area and brought into the operating room.  The patient was placed supine with VTE prophylaxis in place.  Appropriate time-outs were performed.  Anesthesia was induced and the patient was intubated.  Foley catheter was placed.  Appropriate antibiotics were infused.  The abdomen was prepped and draped in a sterile fashion. An infraumbilical incision was made. A cutdown technique was used to enter the abdominal cavity without injury, and a Hasson trocar was inserted.  Pneumoperitoneum was obtained with appropriate opening pressures.  Two 5-mm ports were placed in the suprapubic and left lateral positions under direct visualization.  The right lower quadrant was inspected and the appendix was identified and found to be acutely inflamed and retrocecal, but not ruptured.  The appendix was carefully dissected. The base of the appendix was dissected out and divided with a standard load Endo GIA. The mesoappendix was divided using the LigaSure.  The appendix was placed in an Endocatch bag and brought out through the umbilical incision.  The right lower quadrant was then inspected again revealing  an intact staple line, no bleeding, and no bowel injury.  The area was thoroughly irrigated.  The 5 mm ports were removed under direct visualization and the Hasson trocar was removed.  The fascial opening was closed using 0 vicryl suture.  Local anesthetic was infused in all incisions and the incisions were closed with 4-0 Monocryl.  The wounds were cleaned and sealed with DermaBond.  Foley catheter was removed and the patient was emerged from anesthesia and extubated and brought to the recovery room for further management.  The patient tolerated the procedure well and all counts were correct at the end of the case.   Howie IllJose Luis Jeffie Spivack, MD

## 2016-09-01 NOTE — ED Provider Notes (Signed)
[facility] Emergency Department Provider Note   ____________________________________________   First MD Initiated Contact with Patient 09/01/16 [phone]     (approximate)  I have reviewed the triage vital signs and the nursing notes.   HISTORY  Chief Complaint Abdominal Pain    HPI Gabrielle Fernandez is a 20 y.o. female who comes into the hospital today with abdominal pain. The patient reports that the pain started in her mid abdomen around noon today. She thought it was initially an upset stomach but she reports that the pain moved over to her right side. She reports that now the pain is sharp in her right lower quadrant. The patient rates her pain an 8 out of 10 in intensity. She's had a cyst on her ovaries when she was younger but it was not this bad. She does not remember what side it was on. The patient's last menstrual period was October 20 but she reports that she does have irregular periods. The patient denies any vaginal discharge and has never had a pelvic exam before. The patient has had some nausea and vomiting at home but denies fever. She is also had some decreased appetite. She denies pain with urination diarrhea or constipation. Given the persistence of this pain the patient decided to come into the hospital to get checked out today.   History reviewed. No pertinent past medical history.  There are no active problems to display for this patient.   History reviewed. No pertinent surgical history.  Prior to Admission medications   Medication Sig Start Date End Date Taking? Authorizing Provider  diclofenac (VOLTAREN) 75 MG EC tablet Take 1 tablet (75 mg total) by mouth 2 (two) times daily. Take with food Patient not taking: Reported on 09/01/2016 04/17/16   Domenick GongAshley Mortenson, MD  metaxalone (SKELAXIN) 800 MG tablet Take 1 tablet (800 mg total) by mouth 3 (three) times daily. Patient not taking: Reported on 09/01/2016 04/17/16   Domenick GongAshley Mortenson, MD      Allergies Patient has no known allergies.  No family history on file.  Social History Social History  Substance Use Topics  . Smoking status: Never Smoker  . Smokeless tobacco: Never Used  . Alcohol use Yes    Review of Systems Constitutional: No fever/chills Eyes: No visual changes. ENT: No sore throat. Cardiovascular: Denies chest pain. Respiratory: Denies shortness of breath. Gastrointestinal:  abdominal pain. nausea,  vomiting.  No diarrhea.  No constipation. Genitourinary: Negative for dysuria. Musculoskeletal: Negative for back pain. Skin: Negative for rash. Neurological: Negative for headaches, focal weakness or numbness.  10-point ROS otherwise negative.  ____________________________________________   PHYSICAL EXAM:  VITAL SIGNS: ED Triage Vitals  Enc Vitals Group     BP 08/31/16 2329 (!) 141/81     Pulse Rate 08/31/16 2329 (!) 117     Resp 08/31/16 2329 20     Temp 08/31/16 2329 99 F (37.2 C)     Temp Source 08/31/16 2329 Oral     SpO2 08/31/16 2329 100 %     Weight 08/31/16 2330 140 lb (63.5 kg)     Height 08/31/16 2330 5\' 7"  (1.702 m)     Head Circumference --      Peak Flow --      Pain Score 08/31/16 2331 8     Pain Loc --      Pain Edu? --      Excl. in GC? --     Constitutional: Alert and oriented. Well appearing  and in Moderate distress. Eyes: Conjunctivae are normal. PERRL. EOMI. Head: Atraumatic. Nose: No congestion/rhinnorhea. Mouth/Throat: Mucous membranes are moist.  Oropharynx non-erythematous. Cardiovascular: Normal rate, regular rhythm. Grossly normal heart sounds.  Good peripheral circulation. Respiratory: Normal respiratory effort.  No retractions. Lungs CTAB. Gastrointestinal: Soft with some right lower quadrant tenderness to palpation. No distention. Positive bowel sounds Genitourinary: Deferred due to patient's lack of previous pelvic exam Musculoskeletal: No lower extremity tenderness nor edema.   Neurologic:  Normal  speech and language.  Skin:  Skin is warm, dry and intact. Marland Kitchen. Psychiatric: Mood and affect are normal.   ____________________________________________   LABS (all labs ordered are listed, but only abnormal results are displayed)  Labs Reviewed  COMPREHENSIVE METABOLIC PANEL - Abnormal; Notable for the following:       Result Value   Alkaline Phosphatase 34 (*)    All other components within normal limits  URINALYSIS COMPLETEWITH MICROSCOPIC ([facility] ONLY) - Abnormal; Notable for the following:    Color, Urine STRAW (*)    APPearance HAZY (*)    Hgb urine dipstick 1+ (*)    Squamous Epithelial / LPF 0-5 (*)    All other components within normal limits  CBC  POC URINE PREG, ED  POCT PREGNANCY, URINE   ____________________________________________  EKG  none ____________________________________________  RADIOLOGY  CT abd and pelvis US pelvis ____________________________________________   PROCEDURES  Procedure(s) performed: None  Procedures  Critical Care performed: No  ____________________________________________   INITIAL IMPRESSION / ASSESSMENT AND PLAN / ED COURSE  Pertinent labs & imaging results that were available during my care of the patient were reviewed by me and considered in my medical decision making (see chart for details).  This is a 20 year old female who comes into the hospital today with some right lower quadrant pain. The patient's history does raise concern for appendicitis. I will send the patient for a CT scan of her abdomen and pelvis. She will receive a liter of normal saline as well as some Zofran and morphine. I will also send the patient for an ultrasound of her pelvis given her history of ovarian cysts. The patient will be reassessed once I received the results of her imaging studies.  Clinical Course as of Sep 01 752  Tue Sep 01, 2016  [id number] Normal ultrasound appearance of the uterus and ovaries. No evidence of adnexal mass or ovarian  torsion.   US Pelvis Complete [AW]  0751 The retrocecal appendix is dilated with periappendiceal edema and fluid consistent with acute appendicitis. No abscess.  Involuting follicle in the left ovary. Small amount of free fluid in the pelvis may be physiologic or reactive. Prominent pelvic veins may indicate pelvic congestion.  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 Mild ureteral dilatation on the left may indicate reflux. No hydronephrosis. Nonobstructing stone in the left kidney.   CT Abdomen Pelvis W Contrast [AW]    Clinical Course User Index [AW] Rebecka ApleyAllison P Taiesha Bovard, MD   The patient's CT scan returned showing a retrocecal appendicitis. The patient did receive a second dose of morphine for her pain. I did contact the surgeon to admit the patient for appendicitis.  ____________________________________________   FINAL CLINICAL IMPRESSION(S) / ED DIAGNOSES  Final diagnoses:  Abdominal pain  Acute appendicitis with localized peritonitis      NEW MEDICATIONS STARTED DURING THIS VISIT:  New Prescriptions   No medications on file     Note:  This document was prepared using Dragon voice recognition software and may include  unintentional dictation errors.    Rebecka Apley, MD 09/01/16 [phone]

## 2016-09-02 ENCOUNTER — Encounter: Payer: Self-pay | Admitting: Surgery

## 2016-09-02 MED ORDER — OXYCODONE HCL 5 MG PO TABS: 5.0000 mg | ORAL_TABLET | ORAL | 0 refills | Status: DC | PRN

## 2016-09-02 MED ORDER — IBUPROFEN 600 MG PO TABS: 600.0000 mg | ORAL_TABLET | Freq: Three times a day (TID) | ORAL | 0 refills | Status: DC | PRN

## 2016-09-02 NOTE — Progress Notes (Signed)
09/02/2016  Subjective: Patient is 1 Day Post-Op status post laparoscopic appendectomy. No acute events overnight. Patient tolerated her clear liquids with no nausea. Reports having pain at the incisions but appropriately controlled with her medications.  Vital signs: Temp:  [97.8 F (36.6 C)-98.9 F (37.2 C)] 98.3 F (36.8 C) (11/29 0659) Pulse Rate:  [59-94] 71 (11/29 0659) Resp:  [14-20] 14 (11/29 0659) BP: (103-117)/(54-71) 103/62 (11/29 0659) SpO2:  [96 %-100 %] 100 % (11/29 0659) Weight:  [63.5 kg (140 lb)] 63.5 kg (140 lb) (11/28 1306)   Intake/Output: 11/28 0701 - 11/29 0700 In: 3730 [P.O.:480; I.V.:3200; IV Piggyback:50] Out: 960 [Urine:950; Blood:10]    Physical Exam: Constitutional: No acute distress Abdomen: Soft, nondistended, appropriately tender to palpation over the incisions. Incisions are clean dry and intact with Dermabond with mild ecchymosis around the incisions but no cellulitis or induration.  Labs:   Recent Labs  08/31/16 2337  WBC 9.2  HGB 14.1  HCT 40.3  PLT 190    Recent Labs  08/31/16 2337  NA 138  K 3.5  CL 106  CO2 26  GLUCOSE 84  BUN 12  CREATININE 0.64  CALCIUM 9.9   No results for input(s): LABPROT, INR in the last 72 hours.  Imaging: No results found.  Assessment/Plan: 20 year old female status post laparoscopic appendectomy.  -We'll advance her diet today and discontinue her IV fluids. -Likely discharge to home today   Howie IllJose Luis Emmalou Hunger, MD [facility]

## 2016-09-02 NOTE — Discharge Summary (Signed)
Patient ID: Gabrielle Fernandez MRN: [id number] DOB/AGE: 533-Feb-1997 20 y.o.  Admit date: 09/01/2016 Discharge date: 09/02/2016   Discharge Diagnoses:  Active Problems:   Acute appendicitis   Procedures: Laparoscopic appendectomy  Hospital Course: Patient was admitted on 11/28 with acute appendicitis. She was taken to the operating room that same day and underwent a laparoscopic appendectomy with no complications. Her diet was slowly advanced and her IV fluids were discontinued. Her pain medications were transitioned to oral medications. She was tolerating a diet, was ambulating, was voiding without issues, had her pain well controlled and her incisions were clean dry and intact. She was deemed ready for discharge to home.  Consults: None  Disposition: 01-Home or Self Care  Discharge Instructions    Call MD for:  difficulty breathing, headache or visual disturbances    Complete by:  As directed    Call MD for:  persistant nausea and vomiting    Complete by:  As directed    Call MD for:  redness, tenderness, or signs of infection (pain, swelling, redness, odor or green/yellow discharge around incision site)    Complete by:  As directed    Call MD for:  severe uncontrolled pain    Complete by:  As directed    Call MD for:  temperature >100.4    Complete by:  As directed    Diet - low sodium heart healthy    Complete by:  As directed    Discharge instructions    Complete by:  As directed    Patient may shower, but do not scrub wounds heavily and dab dry only. Do not apply any ointments or hydrogen peroxide to the wounds.   Driving Restrictions    Complete by:  As directed    Do not drive while taking narcotics for pain control.   Increase activity slowly    Complete by:  As directed    Lifting restrictions    Complete by:  As directed    No heavy lifting of more than 10-15 pounds for 4 weeks.   No dressing needed    Complete by:  As directed        Medication List     TAKE these medications   diclofenac 75 MG EC tablet Commonly known as:  VOLTAREN Take 1 tablet (75 mg total) by mouth 2 (two) times daily. Take with food   ibuprofen 600 MG tablet Commonly known as:  ADVIL,MOTRIN Take 1 tablet (600 mg total) by mouth every 8 (eight) hours as needed for mild pain or moderate pain.   metaxalone 800 MG tablet Commonly known as:  SKELAXIN Take 1 tablet (800 mg total) by mouth 3 (three) times daily.   oxyCODONE 5 MG immediate release tablet Commonly known as:  Oxy IR/ROXICODONE Take 1-2 tablets (5-10 mg total) by mouth every 4 (four) hours as needed for moderate pain.      Follow-up Information    Henrene DodgeJose Nykayla Marcelli, MD Follow up in 1 week(s).   Specialty:  Surgery Contact information: [address] [place] [id number] [phone]

## 2016-09-02 NOTE — Progress Notes (Signed)
Patient has walked in hall this am

## 2016-09-02 NOTE — Progress Notes (Signed)
All discharge instructions given to patient and she voices understanding of all instructions given. Iv d/c'd , prescriptions given. Patient discharged home with family escorted out by auxillary

## 2016-09-03 LAB — SURGICAL PATHOLOGY

## 2017-12-13 ENCOUNTER — Other Ambulatory Visit: Payer: Self-pay

## 2017-12-13 DIAGNOSIS — R1011 Right upper quadrant pain: Secondary | ICD-10-CM | POA: Diagnosis present

## 2017-12-13 DIAGNOSIS — N200 Calculus of kidney: Secondary | ICD-10-CM | POA: Insufficient documentation

## 2017-12-13 DIAGNOSIS — R112 Nausea with vomiting, unspecified: Secondary | ICD-10-CM | POA: Insufficient documentation

## 2017-12-13 LAB — COMPREHENSIVE METABOLIC PANEL
ALBUMIN: 4.7 g/dL (ref 3.5–5.0)
ALK PHOS: 46 U/L (ref 38–126)
ALT: 16 U/L (ref 14–54)
AST: 20 U/L (ref 15–41)
Anion gap: 7 (ref 5–15)
BUN: 10 mg/dL (ref 6–20)
CALCIUM: 9.5 mg/dL (ref 8.9–10.3)
CHLORIDE: 106 mmol/L (ref 101–111)
CO2: 26 mmol/L (ref 22–32)
CREATININE: 0.72 mg/dL (ref 0.44–1.00)
GFR calc Af Amer: >60 mL/min (ref >60)
GFR calc non Af Amer: >60 mL/min (ref >60)
GLUCOSE: 103 mg/dL — AB (ref 65–99)
Potassium: 3.6 mmol/L (ref 3.5–5.1)
SODIUM: 139 mmol/L (ref 135–145)
Total Bilirubin: 0.6 mg/dL (ref 0.3–1.2)
Total Protein: 8.1 g/dL (ref 6.5–8.1)

## 2017-12-13 LAB — LIPASE, BLOOD: LIPASE: 23 U/L (ref 11–51)

## 2017-12-13 LAB — URINALYSIS, COMPLETE (UACMP) WITH MICROSCOPIC
Bilirubin Urine: NEGATIVE
GLUCOSE, UA: NEGATIVE mg/dL
KETONES UR: NEGATIVE mg/dL
Leukocytes, UA: NEGATIVE
Nitrite: NEGATIVE
PH: 6 (ref 5.0–8.0)
PROTEIN: NEGATIVE mg/dL
Specific Gravity, Urine: 1.016 (ref 1.005–1.030)

## 2017-12-13 LAB — CBC
HCT: 40.2 % (ref 35.0–47.0)
Hemoglobin: 13.4 g/dL (ref 12.0–16.0)
MCH: 30 pg (ref 26.0–34.0)
MCHC: 33.4 g/dL (ref 32.0–36.0)
MCV: 89.9 fL (ref 80.0–100.0)
PLATELETS: 215 10*3/uL (ref 150–440)
RBC: 4.47 MIL/uL (ref 3.80–5.20)
RDW: 12 % (ref 11.5–14.5)
WBC: 4.7 10*3/uL (ref 3.6–11.0)

## 2017-12-13 LAB — POCT PREGNANCY, URINE: Preg Test, Ur: NEGATIVE

## 2017-12-13 NOTE — ED Triage Notes (Signed)
Pt arrives to ED via POV from home with c/o RUQ abdominal pain x1.5 weeks with occasional radiation into the epigastric area. Pt reports (+) N/V, but denies diarrhea. Pt reports pain increases significantly after eating. No chest pain or SHOB. Pt is A&O, in NAD; RR even, regular, and unlabored.

## 2017-12-14 ENCOUNTER — Emergency Department
Admission: EM | Admit: 2017-12-14 | Discharge: 2017-12-14 | Disposition: A | Discharge status: Home / Self Care | Payer: BLUE CROSS/BLUE SHIELD | Attending: Emergency Medicine | Admitting: Emergency Medicine

## 2017-12-14 ENCOUNTER — Emergency Department: Payer: BLUE CROSS/BLUE SHIELD

## 2017-12-14 ENCOUNTER — Encounter: Payer: Self-pay | Admitting: Radiology

## 2017-12-14 DIAGNOSIS — N200 Calculus of kidney: Secondary | ICD-10-CM

## 2017-12-14 MED ORDER — GI COCKTAIL ~~LOC~~
30.0000 mL | Freq: Once | ORAL | Status: AC
  Administered 2017-12-14: 30 mL via ORAL
  Filled 2017-12-14: qty 30

## 2017-12-14 MED ORDER — IOPAMIDOL (ISOVUE-300) INJECTION 61%
100.0000 mL | Freq: Once | INTRAVENOUS | Status: AC | PRN
  Administered 2017-12-14: 100 mL via INTRAVENOUS

## 2017-12-14 MED ORDER — OXYCODONE-ACETAMINOPHEN 5-325 MG PO TABS: 1.0000 | ORAL_TABLET | ORAL | 0 refills | Status: DC | PRN

## 2017-12-14 MED ORDER — ONDANSETRON HCL 4 MG/2ML IJ SOLN
4.0000 mg | Freq: Once | INTRAMUSCULAR | Status: AC
  Administered 2017-12-14: 4 mg via INTRAVENOUS
  Filled 2017-12-14: qty 2

## 2017-12-14 MED ORDER — ONDANSETRON 4 MG PO TBDP: 4.0000 mg | ORAL_TABLET | Freq: Three times a day (TID) | ORAL | 0 refills | Status: DC | PRN

## 2017-12-14 MED ORDER — MORPHINE SULFATE (PF) 2 MG/ML IV SOLN
2.0000 mg | Freq: Once | INTRAVENOUS | Status: AC
  Administered 2017-12-14: 2 mg via INTRAVENOUS
  Filled 2017-12-14: qty 1

## 2017-12-14 NOTE — ED Provider Notes (Signed)
[facility] Emergency Department Provider Note    First MD Initiated Contact with Patient 12/14/17 0148     (approximate)  I have reviewed the triage vital signs and the nursing notes.   HISTORY  Chief Complaint Abdominal Pain; Emesis; and Nausea    HPI Gabrielle Fernandez C Kandel is a 22 y.o. female presents to the emergency department with 2-week history of upper quadrant abdominal pain accompanied by nausea and vomiting.  Patient denies any diarrhea.  Patient denies any hematuria.  Patient does however admit to urinary frequency.  Patient denies any fever.  Patient denies any chest pain or shortness of breath patient states current pain score is 7 out of 10.   Past Medical History:  Diagnosis Date  . Medical history non-contributory     Patient Active Problem List   Diagnosis Date Noted  . Acute appendicitis 09/01/2016  . Acute appendicitis with localized peritonitis     Past Surgical History:  Procedure Laterality Date  . LAPAROSCOPIC APPENDECTOMY N/A 09/01/2016   Procedure: APPENDECTOMY LAPAROSCOPIC;  Surgeon: Henrene DodgeJose Piscoya, MD;  Location: [facility] ORS;  Service: General;  Laterality: N/A;  . NO PAST SURGERIES      Prior to Admission medications   Medication Sig Start Date End Date Taking? Authorizing Provider  diclofenac (VOLTAREN) 75 MG EC tablet Take 1 tablet (75 mg total) by mouth 2 (two) times daily. Take with food Patient not taking: Reported on 09/01/2016 04/17/16   Domenick GongMortenson, Ashley, MD  ibuprofen (ADVIL,MOTRIN) 600 MG tablet Take 1 tablet (600 mg total) by mouth every 8 (eight) hours as needed for mild pain or moderate pain. 09/02/16   Henrene DodgePiscoya, Jose, MD  metaxalone (SKELAXIN) 800 MG tablet Take 1 tablet (800 mg total) by mouth 3 (three) times daily. Patient not taking: Reported on 09/01/2016 04/17/16   Domenick GongMortenson, Ashley, MD  oxyCODONE (OXY IR/ROXICODONE) 5 MG immediate release tablet Take 1-2 tablets (5-10 mg total) by mouth every 4 (four) hours as  needed for moderate pain. 09/02/16   Henrene DodgePiscoya, Jose, MD    Allergies No known drug allergies No family history on file.  Social History Social History   Tobacco Use  . Smoking status: Never Smoker  . Smokeless tobacco: Never Used  Substance Use Topics  . Alcohol use: Yes  . Drug use: No    Review of Systems Constitutional: No fever/chills Eyes: No visual changes. ENT: No sore throat. Cardiovascular: Denies chest pain. Respiratory: Denies shortness of breath. Gastrointestinal: Positive for left upper quadrant/flank pain, positive intermittent nausea and vomiting no diarrhea.  No constipation. Genitourinary: Negative for dysuria. Musculoskeletal: Negative for neck pain.  Negative for back pain. Integumentary: Negative for rash. Neurological: Negative for headaches, focal weakness or numbness.   ____________________________________________   PHYSICAL EXAM:  VITAL SIGNS: ED Triage Vitals  Enc Vitals Group     BP 12/13/17 2227 (!) 143/90     Pulse Rate 12/13/17 2227 72     Resp 12/13/17 2227 18     Temp 12/13/17 2227 98.5 F (36.9 C)     Temp Source 12/13/17 2227 Oral     SpO2 12/13/17 2227 100 %     Weight 12/13/17 2226 65.8 kg (145 lb)     Height 12/13/17 2226 1.702 m (5\' 7" )     Head Circumference --      Peak Flow --      Pain Score 12/13/17 2237 1     Pain Loc --      Pain Edu? --  Excl. in GC? --     Constitutional: Alert and oriented. Well appearing and in no acute distress. Eyes: Conjunctivae are normal.  Head: Atraumatic. Mouth/Throat: Mucous membranes are moist.  Oropharynx non-erythematous. Neck: No stridor.  Cardiovascular: Normal rate, regular rhythm. Good peripheral circulation. Grossly normal heart sounds. Respiratory: Normal respiratory effort.  No retractions. Lungs CTAB. Gastrointestinal: Soft and nontender. No distention.  Musculoskeletal: No lower extremity tenderness nor edema. No gross deformities of extremities. Neurologic:  Normal  speech and language. No gross focal neurologic deficits are appreciated.  Skin:  Skin is warm, dry and intact. No rash noted. Psychiatric: Mood and affect are normal. Speech and behavior are normal.  ____________________________________________   LABS (all labs ordered are listed, but only abnormal results are displayed)  Labs Reviewed  COMPREHENSIVE METABOLIC PANEL - Abnormal; Notable for the following components:      Result Value   Glucose, Bld 103 (*)    All other components within normal limits  URINALYSIS, COMPLETE (UACMP) WITH MICROSCOPIC - Abnormal; Notable for the following components:   Color, Urine YELLOW (*)    APPearance CLEAR (*)    Hgb urine dipstick MODERATE (*)    Bacteria, UA RARE (*)    Squamous Epithelial / LPF 0-5 (*)    All other components within normal limits  LIPASE, BLOOD  CBC  POC URINE PREG, ED  POCT PREGNANCY, URINE   _______  RADIOLOGY I, Cornucopia N BROWN, personally viewed and evaluated these images (plain radiographs) as part of my medical decision making, as well as reviewing the written report by the radiologist.  ED MD interpretation: 2 mm left lower pole kidney stone  Official radiology report(s): Ct Abdomen Pelvis W Contrast  Result Date: 12/14/2017 CLINICAL DATA:  Acute onset of right upper quadrant abdominal pain, radiating to the epigastric region. Nausea and vomiting. EXAM: CT ABDOMEN AND PELVIS WITH CONTRAST TECHNIQUE: Multidetector CT imaging of the abdomen and pelvis was performed using the standard protocol following bolus administration of intravenous contrast. CONTRAST:  ISOVUE-300 IOPAMIDOL (ISOVUE-300) INJECTION 61% COMPARISON:  CT of the abdomen and pelvis from 09/01/2016 FINDINGS: Lower chest: The visualized lung bases are grossly clear. The visualized portions of the mediastinum are unremarkable. Hepatobiliary: The liver is unremarkable in appearance. The gallbladder is unremarkable in appearance. The common bile duct  remains normal in caliber. Pancreas: The pancreas is within normal limits. Spleen: The spleen is unremarkable in appearance. Adrenals/Urinary Tract: The adrenal glands are unremarkable in appearance. A tiny nonobstructing 2 mm stone is noted at the lower pole of the left kidney. No obstructing ureteral stones are identified. Minimal left-sided renal pelvicaliectasis remains within normal limits. There is no evidence of hydronephrosis. No perinephric stranding is seen. The right kidney is unremarkable in appearance. Stomach/Bowel: The stomach is unremarkable in appearance. The small bowel is within normal limits. The patient is status post appendectomy. The colon is unremarkable in appearance. Vascular/Lymphatic: The abdominal aorta is unremarkable in appearance. The inferior vena cava is grossly unremarkable. No retroperitoneal lymphadenopathy is seen. No pelvic sidewall lymphadenopathy is identified. Reproductive: The bladder is mildly distended and grossly unremarkable. The uterus is grossly unremarkable in appearance. The ovaries are relatively symmetric. No suspicious adnexal masses are seen. Other: No additional soft tissue abnormalities are seen. Musculoskeletal: No acute osseous abnormalities are identified. The visualized musculature is unremarkable in appearance. IMPRESSION: 1. No acute abnormality seen to explain the patient's symptoms. 2. 2 mm nonobstructing stone at the lower pole of the left kidney. Electronically Signed  By: Roanna Raider M.D.   On: 12/14/2017 03:55     Procedures   ____________________________________________   INITIAL IMPRESSION / ASSESSMENT AND PLAN / ED COURSE  As part of my medical decision making, I reviewed the following data within the electronic MEDICAL RECORD NUMBER   22 year old female present with above-stated history and physical exam secondary to left upper quadrant/flank pain.  Concern for possible pyelonephritis however no CVA tenderness.  Concern for  possible kidney stone and as such CT scan of the abdomen was performed which revealed a left lower pole 2 mm stone.  Suspect this to be the etiology of the patient's discomfort.  Patient given IV morphine and Zofran emergency department with complete resolution of pain and nausea.  Patient will be referred to urology for further outpatient follow-up. ____________________________________________  FINAL CLINICAL IMPRESSION(S) / ED DIAGNOSES  Final diagnoses:  Kidney stone on left side     MEDICATIONS GIVEN DURING THIS VISIT:  Medications  gi cocktail (Maalox,Lidocaine,Donnatal) (30 mLs Oral Given 12/14/17 0313)  morphine 2 MG/ML injection 2 mg (2 mg Intravenous Given 12/14/17 0311)  ondansetron (ZOFRAN) injection 4 mg (4 mg Intravenous Given 12/14/17 0309)  iopamidol (ISOVUE-300) 61 % injection 100 mL (100 mLs Intravenous Contrast Given 12/14/17 0342)     ED Discharge Orders    None       Note:  This document was prepared using Dragon voice recognition software and may include unintentional dictation errors.    Darci Current, MD 12/14/17 [phone]

## 2017-12-14 NOTE — ED Notes (Signed)
Dr. Manson PasseyBrown at the bedside to discuss pt plan of care and discharge. Pt verbalized understanding.

## 2017-12-22 ENCOUNTER — Emergency Department: Payer: BLUE CROSS/BLUE SHIELD

## 2017-12-22 ENCOUNTER — Other Ambulatory Visit: Payer: Self-pay

## 2017-12-22 ENCOUNTER — Emergency Department
Admission: EM | Admit: 2017-12-22 | Discharge: 2017-12-22 | Disposition: A | Discharge status: Home / Self Care | Payer: BLUE CROSS/BLUE SHIELD | Attending: Student in an Organized Health Care Education/Training Program | Admitting: Student in an Organized Health Care Education/Training Program

## 2017-12-22 DIAGNOSIS — Z79899 Other long term (current) drug therapy: Secondary | ICD-10-CM | POA: Insufficient documentation

## 2017-12-22 DIAGNOSIS — R1011 Right upper quadrant pain: Secondary | ICD-10-CM | POA: Insufficient documentation

## 2017-12-22 LAB — LIPASE, BLOOD: LIPASE: 18 U/L (ref 11–51)

## 2017-12-22 LAB — URINALYSIS, COMPLETE (UACMP) WITH MICROSCOPIC
BACTERIA UA: NONE SEEN
Bilirubin Urine: NEGATIVE
Glucose, UA: NEGATIVE mg/dL
Hgb urine dipstick: NEGATIVE
KETONES UR: 20 mg/dL — AB
Leukocytes, UA: NEGATIVE
Nitrite: NEGATIVE
PROTEIN: NEGATIVE mg/dL
Specific Gravity, Urine: 1.026 (ref 1.005–1.030)
pH: 5 (ref 5.0–8.0)

## 2017-12-22 LAB — COMPREHENSIVE METABOLIC PANEL
ALBUMIN: 4.6 g/dL (ref 3.5–5.0)
ALK PHOS: 36 U/L — AB (ref 38–126)
ALT: 15 U/L (ref 14–54)
ANION GAP: 10 (ref 5–15)
AST: 16 U/L (ref 15–41)
BUN: 10 mg/dL (ref 6–20)
CALCIUM: 9.2 mg/dL (ref 8.9–10.3)
CHLORIDE: 103 mmol/L (ref 101–111)
CO2: 24 mmol/L (ref 22–32)
Creatinine, Ser: 0.63 mg/dL (ref 0.44–1.00)
GFR calc non Af Amer: >60 mL/min (ref >60)
GLUCOSE: 77 mg/dL (ref 65–99)
Potassium: 3.4 mmol/L — ABNORMAL LOW (ref 3.5–5.1)
SODIUM: 137 mmol/L (ref 135–145)
Total Bilirubin: 1.1 mg/dL (ref 0.3–1.2)
Total Protein: 7.8 g/dL (ref 6.5–8.1)

## 2017-12-22 LAB — CBC
HCT: 39.4 % (ref 35.0–47.0)
HEMOGLOBIN: 13.5 g/dL (ref 12.0–16.0)
MCH: 30.6 pg (ref 26.0–34.0)
MCHC: 34.4 g/dL (ref 32.0–36.0)
MCV: 89.1 fL (ref 80.0–100.0)
Platelets: 171 10*3/uL (ref 150–440)
RBC: 4.42 MIL/uL (ref 3.80–5.20)
RDW: 12 % (ref 11.5–14.5)
WBC: 4.4 10*3/uL (ref 3.6–11.0)

## 2017-12-22 LAB — POCT PREGNANCY, URINE: PREG TEST UR: NEGATIVE

## 2017-12-22 MED ORDER — MORPHINE SULFATE (PF) 4 MG/ML IV SOLN
4.0000 mg | INTRAVENOUS | Status: DC | PRN
  Administered 2017-12-22: 4 mg via INTRAVENOUS
  Filled 2017-12-22: qty 1

## 2017-12-22 MED ORDER — METOCLOPRAMIDE HCL 10 MG PO TABS: 10.0000 mg | ORAL_TABLET | Freq: Four times a day (QID) | ORAL | 0 refills | Status: DC | PRN

## 2017-12-22 MED ORDER — PROMETHAZINE HCL 25 MG/ML IJ SOLN
12.5000 mg | Freq: Four times a day (QID) | INTRAMUSCULAR | Status: DC | PRN
Start: 2017-12-22 — End: 2017-12-22
  Administered 2017-12-22: 12.5 mg via INTRAVENOUS
  Filled 2017-12-22: qty 1

## 2017-12-22 MED ORDER — SODIUM CHLORIDE 0.9 % IV BOLUS (SEPSIS)
1000.0000 mL | Freq: Once | INTRAVENOUS | Status: AC
Start: 2017-12-22 — End: 2017-12-22
  Administered 2017-12-22: 1000 mL via INTRAVENOUS

## 2017-12-22 MED ORDER — POLYETHYLENE GLYCOL 3350 17 G PO PACK: 17.0000 g | PACK | Freq: Every day | ORAL | 0 refills | Status: DC

## 2017-12-22 MED ORDER — KETOROLAC TROMETHAMINE 30 MG/ML IJ SOLN
15.0000 mg | Freq: Once | INTRAMUSCULAR | Status: AC
  Administered 2017-12-22: 15 mg via INTRAVENOUS
  Filled 2017-12-22: qty 1

## 2017-12-22 MED ORDER — DICYCLOMINE HCL 10 MG PO CAPS: 10.0000 mg | ORAL_CAPSULE | Freq: Three times a day (TID) | ORAL | 0 refills | Status: DC | PRN

## 2017-12-22 NOTE — ED Triage Notes (Signed)
Pt c/o epigastric and chest pain X 5 days, RUQ pain X 5 days, LUQ pain X 2 weeks. Dx with kidney stone on left side on 12/13/17-states pain no better from that. Pt alert and oriented X4, active, cooperative, pt in NAD. RR even and unlabored, color WNL.  Denies urinary sx.

## 2017-12-22 NOTE — ED Provider Notes (Signed)
[facility] Emergency Department Provider Note    None    (approximate)  I have reviewed the triage vital signs and the nursing notes.   HISTORY  Chief Complaint Chest Pain and Abdominal Pain    HPI Gabrielle Fernandez is a 22 y.o. female with recent visit to the ER with chief complaint of abdominal pain diagnosed with left-sided ureterolithiasis discharged home with pain medication Ri presents to the ER today with persistent epigastric pain and right upper quadrant pain associated with nausea but no vomiting.  States that eating makes it worse.  States she is having normal bowel movements.  States the pain is mild to moderate and intermittent.  No pain radiating through to her back.  Denies any dysuria.  Denies any chance of being pregnant.  No discharge.  No family or personal history of IBD.  Past Medical History:  Diagnosis Date  . Medical history non-contributory    No family history on file. Past Surgical History:  Procedure Laterality Date  . LAPAROSCOPIC APPENDECTOMY N/A 09/01/2016   Procedure: APPENDECTOMY LAPAROSCOPIC;  Surgeon: Henrene Dodge, MD;  Location: [facility] ORS;  Service: General;  Laterality: N/A;  . NO PAST SURGERIES     Patient Active Problem List   Diagnosis Date Noted  . Acute appendicitis 09/01/2016  . Acute appendicitis with localized peritonitis       Prior to Admission medications   Medication Sig Start Date End Date Taking? Authorizing Provider  diclofenac (VOLTAREN) 75 MG EC tablet Take 1 tablet (75 mg total) by mouth 2 (two) times daily. Take with food Patient not taking: Reported on 09/01/2016 04/17/16   Domenick Gong, MD  dicyclomine (BENTYL) 10 MG capsule Take 1 capsule (10 mg total) by mouth 3 (three) times daily as needed for up to 14 days for spasms. 12/22/17 01/05/18  Willy Eddy, MD  ibuprofen (ADVIL,MOTRIN) 600 MG tablet Take 1 tablet (600 mg total) by mouth every 8 (eight) hours as needed for mild pain or  moderate pain. 09/02/16   Henrene Dodge, MD  metaxalone (SKELAXIN) 800 MG tablet Take 1 tablet (800 mg total) by mouth 3 (three) times daily. Patient not taking: Reported on 09/01/2016 04/17/16   Domenick Gong, MD  metoCLOPramide (REGLAN) 10 MG tablet Take 1 tablet (10 mg total) by mouth every 6 (six) hours as needed for nausea. 12/22/17 12/22/18  Willy Eddy, MD  ondansetron (ZOFRAN ODT) 4 MG disintegrating tablet Take 1 tablet (4 mg total) by mouth every 8 (eight) hours as needed for nausea or vomiting. 12/14/17   Darci Current, MD  oxyCODONE (OXY IR/ROXICODONE) 5 MG immediate release tablet Take 1-2 tablets (5-10 mg total) by mouth every 4 (four) hours as needed for moderate pain. 09/02/16   Henrene Dodge, MD  oxyCODONE-acetaminophen (PERCOCET) 5-325 MG tablet Take 1 tablet by mouth every 4 (four) hours as needed for severe pain. 12/14/17   Darci Current, MD  polyethylene glycol [facility] Hal) packet Take 17 g by mouth daily. Mix one tablespoon with 8oz of your favorite juice or water every day until you are having soft formed stools. Then start taking once daily if you didn't have a stool the day before. 12/22/17   Willy Eddy, MD    Allergies Patient has no known allergies.    Social History Social History   Tobacco Use  . Smoking status: Never Smoker  . Smokeless tobacco: Never Used  Substance Use Topics  . Alcohol use: Yes  . Drug  use: No    Review of Systems Patient denies headaches, rhinorrhea, blurry vision, numbness, shortness of breath, chest pain, edema, cough, abdominal pain, nausea, vomiting, diarrhea, dysuria, fevers, rashes or hallucinations unless otherwise stated above in HPI. ____________________________________________   PHYSICAL EXAM:  VITAL SIGNS: Vitals:   12/22/17 1552 12/22/17 1910  BP: (!) 141/89 133/85  Pulse: 79 79  Resp: 18 16  Temp: 98.2 F (36.8 C)   SpO2: 100% 100%    Constitutional: Alert and oriented. Well  appearing and in no acute distress. Eyes: Conjunctivae are normal.  Head: Atraumatic. Nose: No congestion/rhinnorhea. Mouth/Throat: Mucous membranes are moist.   Neck: No stridor. Painless ROM.  Cardiovascular: Normal rate, regular rhythm. Grossly normal heart sounds.  Good peripheral circulation. Respiratory: Normal respiratory effort.  No retractions. Lungs CTAB. Gastrointestinal: Soft with mild ruq pain. No distention. No abdominal bruits. No CVA tenderness. Genitourinary:  Musculoskeletal: No lower extremity tenderness nor edema.  No joint effusions. Neurologic:  Normal speech and language. No gross focal neurologic deficits are appreciated. No facial droop Skin:  Skin is warm, dry and intact. No rash noted. Psychiatric: Mood and affect are normal. Speech and behavior are normal.  ____________________________________________   LABS (all labs ordered are listed, but only abnormal results are displayed)  Results for orders placed or performed during the hospital encounter of 12/22/17 (from the past 24 hour(s))  Lipase, blood     Status: None   Collection Time: 12/22/17  3:54 PM  Result Value Ref Range   Lipase 18 11 - 51 U/L  Comprehensive metabolic panel     Status: Abnormal   Collection Time: 12/22/17  3:54 PM  Result Value Ref Range   Sodium 137 135 - 145 mmol/L   Potassium 3.4 (L) 3.5 - 5.1 mmol/L   Chloride 103 101 - 111 mmol/L   CO2 24 22 - 32 mmol/L   Glucose, Bld 77 65 - 99 mg/dL   BUN 10 6 - 20 mg/dL   Creatinine, Ser 0.10 0.44 - 1.00 mg/dL   Calcium 9.2 8.9 - 27.2 mg/dL   Total Protein 7.8 6.5 - 8.1 g/dL   Albumin 4.6 3.5 - 5.0 g/dL   AST 16 15 - 41 U/L   ALT 15 14 - 54 U/L   Alkaline Phosphatase 36 (L) 38 - 126 U/L   Total Bilirubin 1.1 0.3 - 1.2 mg/dL   GFR calc non Af Amer >60 >60 mL/min   GFR calc Af Amer >60 >60 mL/min   Anion gap 10 5 - 15  CBC     Status: None   Collection Time: 12/22/17  3:54 PM  Result Value Ref Range   WBC 4.4 3.6 - 11.0 K/uL    RBC 4.42 3.80 - 5.20 MIL/uL   Hemoglobin 13.5 12.0 - 16.0 g/dL   HCT 53.6 64.4 - 03.4 %   MCV 89.1 80.0 - 100.0 fL   MCH 30.6 26.0 - 34.0 pg   MCHC 34.4 32.0 - 36.0 g/dL   RDW 74.2 59.5 - 63.8 %   Platelets 171 150 - 440 K/uL  Urinalysis, Complete w Microscopic     Status: Abnormal   Collection Time: 12/22/17  3:54 PM  Result Value Ref Range   Color, Urine YELLOW (A) YELLOW   APPearance HAZY (A) CLEAR   Specific Gravity, Urine 1.026 1.005 - 1.030   pH 5.0 5.0 - 8.0   Glucose, UA NEGATIVE NEGATIVE mg/dL   Hgb urine dipstick NEGATIVE NEGATIVE   Bilirubin Urine  NEGATIVE NEGATIVE   Ketones, ur 20 (A) NEGATIVE mg/dL   Protein, ur NEGATIVE NEGATIVE mg/dL   Nitrite NEGATIVE NEGATIVE   Leukocytes, UA NEGATIVE NEGATIVE   RBC / HPF 0-5 0 - 5 RBC/hpf   WBC, UA 0-5 0 - 5 WBC/hpf   Bacteria, UA NONE SEEN NONE SEEN   Squamous Epithelial / LPF 6-30 (A) NONE SEEN   Mucus PRESENT   Pregnancy, urine POC     Status: None   Collection Time: 12/22/17  4:01 PM  Result Value Ref Range   Preg Test, Ur NEGATIVE NEGATIVE   ____________________________________________ __________________________________  RADIOLOGY  I personally reviewed all radiographic images ordered to evaluate for the above acute complaints and reviewed radiology reports and findings.  These findings were personally discussed with the patient.  Please see medical record for radiology report.  ____________________________________________   PROCEDURES  Procedure(s) performed:  Procedures    Critical Care performed: no ____________________________________________   INITIAL IMPRESSION / ASSESSMENT AND PLAN / ED COURSE  Pertinent labs & imaging results that were available during my care of the patient were reviewed by me and considered in my medical decision making (see chart for details).  DDX: Gastritis, PUD, cholelithiasis, biliary dyskinesia, cholecystitis, hepatitis, constipation, SBO  Amazing C Urieta is a 22  y.o. who presents to the ED with symptoms as described above.  Right upper quadrant ultrasound ordered to evaluate for biliary colic stones or cholecystitis.  Right upper quadrant ultrasound is reassuring.  KUB ordered also evaluate stool burden based on her recent initiation of narcotic medications to evaluate for constipation.  KUB does show moderate stool burden.  Blood work is otherwise reassuring.  Patient not pregnant.  No evidence of infectious process.  I feel that repeat CT imaging clinically indicated.  Patient given IV fluids as well as IV antiemetics and IV pain medication with improvement in symptoms.  Will be referred to outpatient clinic for evaluation and possible HIDA scan if felt to be still indicated.  Have discussed with the patient and available family all diagnostics and treatments performed thus far and all questions were answered to the best of my ability. The patient demonstrates understanding and agreement with plan.       As part of my medical decision making, I reviewed the following data within the electronic MEDICAL RECORD NUMBER Nursing notes reviewed and incorporated, Labs reviewed, notes from prior ED visits.   ____________________________________________   FINAL CLINICAL IMPRESSION(S) / ED DIAGNOSES  Final diagnoses:  RUQ abdominal pain      NEW MEDICATIONS STARTED DURING THIS VISIT:  Discharge Medication List as of 12/22/2017  7:03 PM    START taking these medications   Details  dicyclomine (BENTYL) 10 MG capsule Take 1 capsule (10 mg total) by mouth 3 (three) times daily as needed for up to 14 days for spasms., Starting Wed 12/22/2017, Until Wed 01/05/2018, Print    metoCLOPramide (REGLAN) 10 MG tablet Take 1 tablet (10 mg total) by mouth every 6 (six) hours as needed for nausea., Starting Wed 12/22/2017, Until Thu 12/22/2018, Print    polyethylene glycol (MIRALAX / GLYCOLAX) packet Take 17 g by mouth daily. Mix one tablespoon with 8oz of your favorite juice or  water every day until you are having soft formed stools. Then start taking once daily if you didn't have a stool the day before., Starting Wed 12/22/2017, Print         Note:  This document was prepared using Conservation officer, historic buildingsDragon voice recognition software  and may include unintentional dictation errors.    Willy Eddy, MD 12/22/17 [phone]

## 2017-12-22 NOTE — Discharge Instructions (Signed)

## 2018-06-29 ENCOUNTER — Other Ambulatory Visit: Payer: Self-pay | Admitting: Family Medicine

## 2018-06-29 DIAGNOSIS — R39198 Other difficulties with micturition: Secondary | ICD-10-CM

## 2018-06-29 DIAGNOSIS — N200 Calculus of kidney: Secondary | ICD-10-CM

## 2018-06-30 ENCOUNTER — Ambulatory Visit: Payer: BLUE CROSS/BLUE SHIELD

## 2018-09-29 ENCOUNTER — Encounter: Payer: Self-pay | Admitting: Emergency Medicine

## 2018-09-29 ENCOUNTER — Ambulatory Visit
Admission: EM | Admit: 2018-09-29 | Discharge: 2018-09-29 | Disposition: A | Discharge status: Home / Self Care | Payer: BLUE CROSS/BLUE SHIELD | Attending: Family Medicine | Admitting: Family Medicine

## 2018-09-29 ENCOUNTER — Other Ambulatory Visit: Payer: Self-pay

## 2018-09-29 DIAGNOSIS — L739 Follicular disorder, unspecified: Secondary | ICD-10-CM | POA: Diagnosis not present

## 2018-09-29 LAB — CBC WITH DIFFERENTIAL/PLATELET
Abs Immature Granulocytes: 0.01 10*3/uL (ref 0.00–0.07)
Basophils Absolute: 0 10*3/uL (ref 0.0–0.1)
Basophils Relative: 0 %
EOS ABS: 0.1 10*3/uL (ref 0.0–0.5)
EOS PCT: 2 %
HEMATOCRIT: 40.4 % (ref 36.0–46.0)
Hemoglobin: 14.1 g/dL (ref 12.0–15.0)
Immature Granulocytes: 0 %
LYMPHS ABS: 2 10*3/uL (ref 0.7–4.0)
Lymphocytes Relative: 35 %
MCH: 30.8 pg (ref 26.0–34.0)
MCHC: 34.9 g/dL (ref 30.0–36.0)
MCV: 88.2 fL (ref 80.0–100.0)
MONO ABS: 0.4 10*3/uL (ref 0.1–1.0)
MONOS PCT: 8 %
NEUTROS PCT: 55 %
Neutro Abs: 3.2 10*3/uL (ref 1.7–7.7)
Platelets: 213 10*3/uL (ref 150–400)
RBC: 4.58 MIL/uL (ref 3.87–5.11)
RDW: 11.6 % (ref 11.5–15.5)
WBC: 5.7 10*3/uL (ref 4.0–10.5)
nRBC: 0 % (ref 0.0–0.2)

## 2018-09-29 MED ORDER — MINOCYCLINE HCL 50 MG PO CAPS: 50.0000 mg | ORAL_CAPSULE | Freq: Two times a day (BID) | ORAL | 0 refills | Status: DC

## 2018-09-29 MED ORDER — PREDNISONE 20 MG PO TABS
20.0000 mg | ORAL_TABLET | Freq: Once | ORAL | Status: AC
  Administered 2018-09-29: 20 mg via ORAL

## 2018-09-29 MED ORDER — PREDNISONE 20 MG PO TABS: 20.0000 mg | ORAL_TABLET | Freq: Every day | ORAL | 0 refills | Status: DC

## 2018-09-29 NOTE — ED Provider Notes (Signed)
[facility]    CSN: [id number] Arrival date & time: 09/29/18  [id number]     History   Chief Complaint Chief Complaint  Patient presents with  . Rash    HPI Gabrielle Fernandez is a 22 y.o. female.   22 yo female with a c/o itchy rash on neck, upper chest, abdomen mainly with few lesions on arms. Rash is red and itchy. Patient denies any other symptoms; denies any travel, sick contacts, change in soaps or detergents, new pets. Denies any URI symptoms, wheezing, swelling, shortness of breath.   The history is provided by the patient.  Rash    Past Medical History:  Diagnosis Date  . Medical history non-contributory     Patient Active Problem List   Diagnosis Date Noted  . Acute appendicitis 09/01/2016  . Acute appendicitis with localized peritonitis     Past Surgical History:  Procedure Laterality Date  . LAPAROSCOPIC APPENDECTOMY Fernandez/A 09/01/2016   Procedure: APPENDECTOMY LAPAROSCOPIC;  Surgeon: Gabrielle DodgeJose Piscoya, MD;  Location: [facility] ORS;  Service: General;  Laterality: Fernandez/A;  . NO PAST SURGERIES      OB History   No obstetric history on file.      Home Medications    Prior to Admission medications   Medication Sig Start Date End Date Taking? Authorizing Provider  diclofenac (VOLTAREN) 75 MG EC tablet Take 1 tablet (75 mg total) by mouth 2 (two) times daily. Take with food Patient not taking: Reported on 09/01/2016 04/17/16   Domenick GongMortenson, Ashley, MD  dicyclomine (BENTYL) 10 MG capsule Take 1 capsule (10 mg total) by mouth 3 (three) times daily as needed for up to 14 days for spasms. 12/22/17 01/05/18  Gabrielle Fernandez, Patrick, MD  ibuprofen (ADVIL,MOTRIN) 600 MG tablet Take 1 tablet (600 mg total) by mouth every 8 (eight) hours as needed for mild pain or moderate pain. 09/02/16   Gabrielle Fernandez, Jose, MD  metaxalone (SKELAXIN) 800 MG tablet Take 1 tablet (800 mg total) by mouth 3 (three) times daily. Patient not taking: Reported on 09/01/2016 04/17/16   Domenick GongMortenson, Ashley, MD    metoCLOPramide (REGLAN) 10 MG tablet Take 1 tablet (10 mg total) by mouth every 6 (six) hours as needed for nausea. 12/22/17 12/22/18  Gabrielle Fernandez, Patrick, MD  minocycline (MINOCIN,DYNACIN) 50 MG capsule Take 1 capsule (50 mg total) by mouth 2 (two) times daily. 09/29/18   Gabrielle Fernandez, Gabrielle Oquin, MD  ondansetron (ZOFRAN ODT) 4 MG disintegrating tablet Take 1 tablet (4 mg total) by mouth every 8 (eight) hours as needed for nausea or vomiting. 12/14/17   Gabrielle Fernandez, Gabrielle Grant N, MD  oxyCODONE (OXY IR/ROXICODONE) 5 MG immediate release tablet Take 1-2 tablets (5-10 mg total) by mouth every 4 (four) hours as needed for moderate pain. 09/02/16   Gabrielle Fernandez, Jose, MD  oxyCODONE-acetaminophen (PERCOCET) 5-325 MG tablet Take 1 tablet by mouth every 4 (four) hours as needed for severe pain. 12/14/17   Gabrielle Fernandez, Sour John N, MD  polyethylene glycol [facility](MIRALAX / Ethelene HalGLYCOLAX) packet Take 17 g by mouth daily. Mix one tablespoon with 8oz of your favorite juice or water every day until you are having soft formed stools. Then start taking once daily if you didn't have a stool the day before. 12/22/17   Gabrielle Fernandez, Patrick, MD  predniSONE (DELTASONE) 20 MG tablet Take 1 tablet (20 mg total) by mouth daily. 09/29/18   Gabrielle Fernandez, Gabrielle Whelchel, MD    Family History History reviewed. No pertinent family history.  Social History Social History   Tobacco Use  . Smoking  status: Never Smoker  . Smokeless tobacco: Never Used  Substance Use Topics  . Alcohol use: Yes  . Drug use: No     Allergies   Patient has no known allergies.   Review of Systems Review of Systems  Skin: Positive for rash.     Physical Exam Triage Vital Signs ED Triage Vitals  Enc Vitals Group     BP 09/29/18 1952 (!) 129/92     Pulse Rate 09/29/18 1952 76     Resp 09/29/18 1952 18     Temp 09/29/18 1952 98.2 F (36.8 C)     Temp Source 09/29/18 1952 Oral     SpO2 09/29/18 1952 100 %     Weight 09/29/18 1948 150 lb (68 kg)     Height 09/29/18 1948 5\' 7"  (1.702 m)      Head Circumference --      Peak Flow --      Pain Score 09/29/18 1948 0     Pain Loc --      Pain Edu? --      Excl. in GC? --    No data found.  Updated Vital Signs BP (!) 129/92 (BP Location: Left Arm)   Pulse 76   Temp 98.2 F (36.8 C) (Oral)   Resp 18   Ht 5\' 7"  (1.702 m)   Wt 68 kg   LMP 09/08/2018   SpO2 100%   BMI 23.49 kg/m   Visual Acuity Right Eye Distance:   Left Eye Distance:   Bilateral Distance:    Right Eye Near:   Left Eye Near:    Bilateral Near:     Physical Exam Vitals signs and nursing note reviewed.  Constitutional:      General: She is not in acute distress.    Appearance: Normal appearance. She is not ill-appearing, toxic-appearing or diaphoretic.  Skin:    Findings: Rash present. Rash is papular.     Comments: Pinpoint papular pustular lesions on neck, chest, abdomen and arms; few petichiae noted   Neurological:     Mental Status: She is alert.      UC Treatments / Results  Labs (all labs ordered are listed, but only abnormal results are displayed) Labs Reviewed  CBC WITH DIFFERENTIAL/PLATELET    EKG None  Radiology No results found.  Procedures Procedures (including critical care time)  Medications Ordered in UC Medications  predniSONE (DELTASONE) tablet 20 mg (has no administration in time range)    Initial Impression / Assessment and Plan / UC Course  I have reviewed the triage vital signs and the nursing notes.  Pertinent labs & imaging results that were available during my care of the patient were reviewed by me and considered in my medical decision making (see chart for details).      Final Clinical Impressions(s) / UC Diagnoses   Final diagnoses:  Folliculitis    ED Prescriptions    Medication Sig Dispense Auth. Provider   minocycline (MINOCIN,DYNACIN) 50 MG capsule Take 1 capsule (50 mg total) by mouth 2 (two) times daily. 14 capsule Ltanya Bayley, Pamala Hurry, MD   predniSONE (DELTASONE) 20 MG tablet Take 1 tablet  (20 mg total) by mouth daily. 5 tablet Gabrielle Mccallum, MD     1. Lab results and diagnosis reviewed with patient 2. rx as per orders above; reviewed possible side effects, interactions, risks and benefits  3. Recommend supportive treatment with otc antihistamines prn 4. Follow-up prn if symptoms worsen or don't improve  Controlled Substance Prescriptions [place] Controlled Substance Registry consulted? Not Applicable   Gabrielle Fernandez, Gabrielle Bultema, MD 09/29/18 2130

## 2018-09-29 NOTE — ED Triage Notes (Signed)
Patient c/o itchy rash that started last night. She states it started on her neck and has now progressed down her left and right arm and on her upper abdomen. Patient has been taking OTC Benadryl with no relief.

## 2018-10-05 NOTE — L&D Delivery Note (Addendum)
OB/GYN Faculty Practice Delivery Note  Gabrielle Fernandez is a 23 y.o. G1P0 s/p vaginal delivery at [redacted]w[redacted]d. She was admitted for IOL 2/2 gHTN.   ROM: 8h 34m with clear fluid GBS Status: Negative Maximum Maternal Temperature: 98.5 F  Labor Progress: She was admitted to the labor floor and received several doses of cytotec. A cook's catheter was placed. She SROMed and then progressed on pitocin.  Delivery Date/Time: 08/25/2019, 0836 hours Delivery: Called to room and patient was complete and pushing. Head delivered ROA. No nuchal cord present. Shoulder and body delivered in usual fashion. Infant with spontaneous cry, placed on mother's abdomen, dried and stimulated. Cord clamped x 2 after 1-minute delay, and cut by FOB under my direct supervision. Cord blood drawn. Placenta delivered spontaneously with gentle cord traction. Fundus firm with massage and Pitocin. Labia, perineum, vagina, and cervix were inspected, and a 2nd degree laceration was noted. This was repaired with Vicryl Rapide in the usual fashion.   Placenta: intact, 3 vessel cord Complications: None Lacerations: 2nd degree, repaired with Vicryl in the usual fashion EBL: 125 Analgesia: Epidural  Postpartum Planning [x]  message to sent to schedule follow-up  [x]  vaccines UTD  Infant: female  APGARs 9 & 9  3055 g  Merilyn Baba, DO OB/GYN Fellow, Faculty Practice

## 2018-11-19 ENCOUNTER — Ambulatory Visit
Admission: EM | Admit: 2018-11-19 | Discharge: 2018-11-19 | Disposition: A | Discharge status: Home / Self Care | Payer: BLUE CROSS/BLUE SHIELD | Attending: Emergency Medicine | Admitting: Emergency Medicine

## 2018-11-19 ENCOUNTER — Encounter: Payer: Self-pay | Admitting: Emergency Medicine

## 2018-11-19 ENCOUNTER — Other Ambulatory Visit: Payer: Self-pay

## 2018-11-19 DIAGNOSIS — J0100 Acute maxillary sinusitis, unspecified: Secondary | ICD-10-CM | POA: Diagnosis not present

## 2018-11-19 MED ORDER — FLUTICASONE PROPIONATE 50 MCG/ACT NA SUSP: 2.0000 | Freq: Every day | NASAL | 0 refills | Status: DC

## 2018-11-19 MED ORDER — IBUPROFEN 600 MG PO TABS: 600.0000 mg | ORAL_TABLET | Freq: Four times a day (QID) | ORAL | 0 refills | Status: DC | PRN

## 2018-11-19 MED ORDER — DOXYCYCLINE HYCLATE 100 MG PO CAPS: 100.0000 mg | ORAL_CAPSULE | Freq: Two times a day (BID) | ORAL | 0 refills | Status: AC

## 2018-11-19 NOTE — ED Triage Notes (Signed)
Patient c/o bodyaches, cough, fever and congestion that started 5 days ago.

## 2018-11-19 NOTE — Discharge Instructions (Signed)
Start Flonase, stop other cold medications.  Start Mucinex-D to keep the mucous thin and to decongest you.  Return to the ER if you get worse, have a fever >100.4, or for any concerns. You may take 600 mg of motrin with 1 gram of tylenol up to 3-4 times a day as needed for pain. This is an effective combination for pain.   Use a NeilMed sinus rinse as often as you want to to reduce nasal congestion. Follow the directions on the box.   Go to www.goodrx.com to look up your medications. This will give you a list of where you can find your prescriptions at the most affordable prices. Or you can ask the pharmacist what the cash price is. This is frequently cheaper than going through insurance.

## 2018-11-19 NOTE — ED Provider Notes (Signed)
HPI  SUBJECTIVE:  Gabrielle Fernandez is a 23 y.o. female who presents with fevers of 102.7, nasal congestion, clear rhinorrhea, sinus pain and pressure, postnasal drip, sinus headache for the past 5 days.  statesThat her upper teeth hurt.   She also reports sneezing, watery eyes.  She reports a cough productive of yellow mucus.  No wheezing, chest pain, shortness of breath.  No neck stiffness, facial swelling.  No sore throat.  No antibiotics in the past month.  She took Tylenol within 4 to 6 hours of evaluation.  She has tried Tylenol Sinus and cough, Mucinex combination cold medicines with improvement in her symptoms.  Her symptoms are worse with bending forward, lying down.  Past medical history negative for allergies, frequent sinusitis, pulmonary disease, smoking, vaping, diabetes, hypertension.  LMP: 1 month ago.  Denies the possibility being pregnant.  PMD: Dortha Kern, MD   Past Medical History:  Diagnosis Date  . Medical history non-contributory     Past Surgical History:  Procedure Laterality Date  . LAPAROSCOPIC APPENDECTOMY N/A 09/01/2016   Procedure: APPENDECTOMY LAPAROSCOPIC;  Surgeon: Henrene Dodge, MD;  Location: [facility] ORS;  Service: General;  Laterality: N/A;  . NO PAST SURGERIES      History reviewed. No pertinent family history.  Social History   Tobacco Use  . Smoking status: Never Smoker  . Smokeless tobacco: Never Used  Substance Use Topics  . Alcohol use: Yes  . Drug use: No    No current facility-administered medications for this encounter.   Current Outpatient Medications:  .  dicyclomine (BENTYL) 10 MG capsule, Take 1 capsule (10 mg total) by mouth 3 (three) times daily as needed for up to 14 days for spasms., Disp: 16 capsule, Rfl: 0 .  doxycycline (VIBRAMYCIN) 100 MG capsule, Take 1 capsule (100 mg total) by mouth 2 (two) times daily for 7 days., Disp: 14 capsule, Rfl: 0 .  fluticasone (FLONASE) 50 MCG/ACT nasal spray, Place 2 sprays into both nostrils  daily., Disp: 16 g, Rfl: 0 .  ibuprofen (ADVIL,MOTRIN) 600 MG tablet, Take 1 tablet (600 mg total) by mouth every 6 (six) hours as needed., Disp: 30 tablet, Rfl: 0  No Known Allergies   ROS  As noted in HPI.   Physical Exam  BP 124/88 (BP Location: Left Arm)   Pulse 75   Temp 97.9 F (36.6 C) (Oral)   Resp 14   Ht 5\' 7"  (1.702 m)   Wt 70.3 kg   LMP 10/26/2018 (Approximate)   SpO2 99%   BMI 24.28 kg/m   Constitutional: Well developed, well nourished, no acute distress Eyes:  EOMI, conjunctiva normal bilaterally HENT: Normocephalic, atraumatic,mucus membranes moist.  Positive clear nasal congestion.  Erythematous, swollen turbinates.  No frontal sinus tenderness.  Positive maxillary sinus tenderness.  No postnasal drip.  Positive cobblestoning.  Tonsils normal without exudates.  Uvula midline. Respiratory: Normal inspiratory effort lungs clear bilaterally Cardiovascular: Normal rate, regular rhythm, no murmurs rubs or gallops GI: nondistended skin: No rash, skin intact Musculoskeletal: no deformities Neurologic: Alert & oriented x 3, no focal neuro deficits Psychiatric: Speech and behavior appropriate   ED Course   Medications - No data to display  No orders of the defined types were placed in this encounter.   No results found for this or any previous visit (from the past 24 hour(s)). No results found.  ED Clinical Impression  Acute non-recurrent maxillary sinusitis   ED Assessment/Plan  Patient with maxillary sinusitis.  Given  her fevers of 102.7, she has clear indications for antibiotics.  Home with doxycycline, Flonase, saline nasal irrigation, Mucinex D, ibuprofen/Tylenol combination, stop other cold medications.  Follow-up with her doctor in 72 hours if not getting better, to the ER if she gets worse.  Discussed MDM, treatment plan, and plan for follow-up with patient. Discussed sn/sx that should prompt return to the ED. patient agrees with plan.   Meds  ordered this encounter  Medications  . doxycycline (VIBRAMYCIN) 100 MG capsule    Sig: Take 1 capsule (100 mg total) by mouth 2 (two) times daily for 7 days.    Dispense:  14 capsule    Refill:  0  . fluticasone (FLONASE) 50 MCG/ACT nasal spray    Sig: Place 2 sprays into both nostrils daily.    Dispense:  16 g    Refill:  0  . ibuprofen (ADVIL,MOTRIN) 600 MG tablet    Sig: Take 1 tablet (600 mg total) by mouth every 6 (six) hours as needed.    Dispense:  30 tablet    Refill:  0    *This clinic note was created using Scientist, clinical (histocompatibility and immunogenetics). Therefore, there may be occasional mistakes despite careful proofreading.   ?    Domenick Gong, MD 11/19/18 1810

## 2019-01-03 ENCOUNTER — Ambulatory Visit (INDEPENDENT_AMBULATORY_CARE_PROVIDER_SITE_OTHER): Payer: BLUE CROSS/BLUE SHIELD | Admitting: Obstetrics & Gynecology

## 2019-01-03 ENCOUNTER — Other Ambulatory Visit: Payer: Self-pay

## 2019-01-03 ENCOUNTER — Encounter: Payer: Self-pay | Admitting: Obstetrics & Gynecology

## 2019-01-03 VITALS — BP 135/89 | HR 72 | Wt 162.4 lb

## 2019-01-03 DIAGNOSIS — Z3401 Encounter for supervision of normal first pregnancy, first trimester: Secondary | ICD-10-CM

## 2019-01-03 DIAGNOSIS — Z3201 Encounter for pregnancy test, result positive: Secondary | ICD-10-CM

## 2019-01-03 DIAGNOSIS — Z3490 Encounter for supervision of normal pregnancy, unspecified, unspecified trimester: Secondary | ICD-10-CM | POA: Insufficient documentation

## 2019-01-03 NOTE — Progress Notes (Signed)
B/P 136/85 Declined flu

## 2019-01-03 NOTE — Progress Notes (Signed)
   Subjective:    Patient ID: Gabrielle Fernandez, female    DOB: March 20, 1996, 23 y.o.   MRN: [id number]  HPI 23 yo G1 with a LMP 1/20 but negative UPTs until 4 days ago. She is here for a "NOB" visit. She is having nausea. She ordered PNVs.   Review of Systems     Objective:   Physical Exam Breathing, conversing, and ambulating normally Well nourished, well hydrated White female, no apparent distress Bedside u/s shows a gestational sac with a yolk sac     Assessment & Plan:  Early pregnancy- start PNVs/MVIs Come back in about 6 weeks for NOB visit

## 2019-02-28 ENCOUNTER — Ambulatory Visit (INDEPENDENT_AMBULATORY_CARE_PROVIDER_SITE_OTHER): Payer: BLUE CROSS/BLUE SHIELD | Admitting: Family Medicine

## 2019-02-28 ENCOUNTER — Other Ambulatory Visit: Payer: Self-pay

## 2019-02-28 ENCOUNTER — Encounter: Payer: Self-pay | Admitting: Family Medicine

## 2019-02-28 VITALS — BP 122/82 | HR 76 | Wt 161.0 lb

## 2019-02-28 DIAGNOSIS — Z3A13 13 weeks gestation of pregnancy: Secondary | ICD-10-CM | POA: Diagnosis not present

## 2019-02-28 DIAGNOSIS — Z3400 Encounter for supervision of normal first pregnancy, unspecified trimester: Secondary | ICD-10-CM

## 2019-02-28 DIAGNOSIS — Z3401 Encounter for supervision of normal first pregnancy, first trimester: Secondary | ICD-10-CM

## 2019-02-28 MED ORDER — BLOOD PRESSURE CUFF MISC: 1.0000 | 0 refills | Status: DC

## 2019-02-28 NOTE — Patient Instructions (Signed)

## 2019-02-28 NOTE — Progress Notes (Addendum)
Subjective:   Gabrielle Fernandez is a 23 y.o. G1P0 at 1571w2d by early ultrasound being seen today for her first obstetrical visit.  Her obstetrical history is not significant.  Pregnancy history fully reviewed.  Patient reports no complaints.  HISTORY: OB History  Gravida Para Term Preterm AB Living  1 0 0 0 0 0  SAB TAB Ectopic Multiple Live Births  0 0 0 0 0    # Outcome Date GA Lbr Len/2nd Weight Sex Delivery Anes PTL Lv  1 Current            Past Medical History:  Diagnosis Date  . Medical history non-contributory    Past Surgical History:  Procedure Laterality Date  . APPENDECTOMY    . LAPAROSCOPIC APPENDECTOMY N/A 09/01/2016   Procedure: APPENDECTOMY LAPAROSCOPIC;  Surgeon: Henrene DodgeJose Piscoya, MD;  Location: [facility] ORS;  Service: General;  Laterality: N/A;   Family History  Problem Relation Age of Onset  . Hypertension Mother    Social History   Tobacco Use  . Smoking status: Never Smoker  . Smokeless tobacco: Never Used  Substance Use Topics  . Alcohol use: Not Currently  . Drug use: No   No Known Allergies Current Outpatient Medications on File Prior to Visit  Medication Sig Dispense Refill  . Prenatal Vit-Fe Fumarate-FA (PRENATAL MULTIVITAMIN) TABS tablet Take 1 tablet by mouth daily at 12 noon.    . dicyclomine (BENTYL) 10 MG capsule Take 1 capsule (10 mg total) by mouth 3 (three) times daily as needed for up to 14 days for spasms. 16 capsule 0  . fluticasone (FLONASE) 50 MCG/ACT nasal spray Place 2 sprays into both nostrils daily. (Patient not taking: Reported on 01/03/2019) 16 g 0  . ibuprofen (ADVIL,MOTRIN) 600 MG tablet Take 1 tablet (600 mg total) by mouth every 6 (six) hours as needed. (Patient not taking: Reported on 01/03/2019) 30 tablet 0   No current facility-administered medications on file prior to visit.      Exam   Vitals:   02/28/19 0956  BP: 122/82  Pulse: 76  Weight: 161 lb (73 kg)   Fetal Heart Rate (bpm): 141  Uterus:     Pelvic  Exam: Perineum: no hemorrhoids, normal perineum   Vulva: normal external genitalia, no lesions   Vagina:  normal mucosa, normal discharge   Cervix: no lesions and normal, pap smear done.    Adnexa: normal adnexa and no mass, fullness, tenderness   Bony Pelvis: average  System: General: well-developed, well-nourished female in no acute distress   Breast:  normal appearance, no masses or tenderness   Skin: normal coloration and turgor, no rashes   Neurologic: oriented, normal, negative, normal mood   Extremities: normal strength, tone, and muscle mass, ROM of all joints is normal   HEENT PERRLA, extraocular movement intact and sclera clear, anicteric   Mouth/Teeth mucous membranes moist, pharynx normal without lesions and dental hygiene good   Neck supple and no masses   Cardiovascular: regular rate and rhythm   Respiratory:  no respiratory distress, normal breath sounds   Abdomen: soft, non-tender; bowel sounds normal; no masses,  no organomegaly  Abd. U/s shows SIUP @ 13 2/7 wks, + movement, + flicker   Assessment:   Pregnancy: G1P0 Patient Active Problem List   Diagnosis Date Noted  . Encounter for supervision of normal first pregnancy in first trimester 01/03/2019     Plan:  1. Supervision of normal first pregnancy, antepartum Will discuss genetic  screening with partner before decision is made about all screening. - Obstetric Panel, Including HIV - Culture, OB Urine - Cytology - PAP - [place] MFM OB COMP + 14 WK; Future - Enroll Patient in Babyscripts - Babyscripts Schedule Optimization   Initial labs drawn. Continue prenatal vitamins. Genetic Screening discussed, NIPS: discussed. Ultrasound discussed; fetal anatomic survey: ordered. Problem list reviewed and updated. The nature of [facility] with multiple MDs and other Advanced Practice Providers was explained to patient; also emphasized that residents, students are part of our team.  Routine obstetric precautions reviewed. Return in 4 weeks (on 03/28/2019).

## 2019-03-01 LAB — OBSTETRIC PANEL, INCLUDING HIV
Antibody Screen: NEGATIVE
Basophils Absolute: 0 10*3/uL (ref 0.0–0.2)
Basos: 0 %
EOS (ABSOLUTE): 0.1 10*3/uL (ref 0.0–0.4)
Eos: 1 %
HIV Screen 4th Generation wRfx: NONREACTIVE
Hematocrit: 39.1 % (ref 34.0–46.6)
Hemoglobin: 13.1 g/dL (ref 11.1–15.9)
Hepatitis B Surface Ag: NEGATIVE
Immature Grans (Abs): 0 10*3/uL (ref 0.0–0.1)
Immature Granulocytes: 0 %
Lymphocytes Absolute: 1.4 10*3/uL (ref 0.7–3.1)
Lymphs: 20 %
MCH: 30 pg (ref 26.6–33.0)
MCHC: 33.5 g/dL (ref 31.5–35.7)
MCV: 90 fL (ref 79–97)
Monocytes Absolute: 0.5 10*3/uL (ref 0.1–0.9)
Monocytes: 7 %
Neutrophils Absolute: 5 10*3/uL (ref 1.4–7.0)
Neutrophils: 72 %
Platelets: 227 10*3/uL (ref 150–450)
RBC: 4.36 x10E6/uL (ref 3.77–5.28)
RDW: 12.4 % (ref 11.7–15.4)
RPR Ser Ql: NONREACTIVE
Rh Factor: POSITIVE
Rubella Antibodies, IGG: 6.88 index (ref >0.99)
WBC: 6.9 10*3/uL (ref 3.4–10.8)

## 2019-03-02 LAB — CYTOLOGY - PAP
Chlamydia: NEGATIVE
Diagnosis: NEGATIVE
Neisseria Gonorrhea: NEGATIVE

## 2019-03-02 LAB — URINE CULTURE, OB REFLEX: Organism ID, Bacteria: NO GROWTH

## 2019-03-02 LAB — CULTURE, OB URINE

## 2019-03-28 ENCOUNTER — Ambulatory Visit (INDEPENDENT_AMBULATORY_CARE_PROVIDER_SITE_OTHER): Payer: BLUE CROSS/BLUE SHIELD | Admitting: Obstetrics & Gynecology

## 2019-03-28 VITALS — BP 124/77 | HR 78 | Wt 165.0 lb

## 2019-03-28 DIAGNOSIS — Z3402 Encounter for supervision of normal first pregnancy, second trimester: Secondary | ICD-10-CM

## 2019-03-28 DIAGNOSIS — Z3A17 17 weeks gestation of pregnancy: Secondary | ICD-10-CM

## 2019-03-28 NOTE — Patient Instructions (Signed)
Return to office for any scheduled appointments. Call the office or go to the MAU at [facility] if:  You begin to have strong, frequent contractions  Your water breaks.  Sometimes it is a big gush of fluid, sometimes it is just a trickle that keeps getting your panties wet or running down your legs  You have vaginal bleeding.  It is normal to have a small amount of spotting if your cervix was checked.   Any other obstetric concerns.   

## 2019-03-28 NOTE — Progress Notes (Signed)
   PRENATAL VISIT NOTE  Subjective:  Gabrielle Fernandez is a 23 y.o. G1P0 at [redacted]w[redacted]d being seen today for ongoing prenatal care.  She is currently monitored for the following issues for this low-risk pregnancy and has Supervision of normal pregnancy on their problem list.  Patient reports no complaints.  Contractions: Not present. Vag. Bleeding: None.  Movement: Present. Denies leaking of fluid.   The following portions of the patient's history were reviewed and updated as appropriate: allergies, current medications, past family history, past medical history, past social history, past surgical history and problem list.   Objective:   Vitals:   03/28/19 0906  BP: 124/77  Pulse: 78  Weight: 165 lb (74.8 kg)  On Babyscripts  Fetal Status: Fetal Heart Rate (bpm): 138   Movement: Present     General:  Alert, oriented and cooperative. Patient is in no acute distress.  Skin: Skin is warm and dry. No rash noted.   Cardiovascular: Normal heart rate noted  Respiratory: Normal respiratory effort, no problems with respiration noted  Abdomen: Soft, gravid, appropriate for gestational age.  Pain/Pressure: Absent     Pelvic: Cervical exam deferred        Extremities: Normal range of motion.  Edema: None  Mental Status: Normal mood and affect. Normal behavior. Normal judgment and thought content.   Assessment and Plan:  Pregnancy: G1P0 at [redacted]w[redacted]d Encounter for supervision of normal first pregnancy in second trimester Declines genetic screening Anatomy scan already scheduled for next week No other complaints or concerns.  Routine obstetric precautions reviewed. Please refer to After Visit Summary for other counseling recommendations.   Return in about 4 weeks (around 04/25/2019) for Virtual OB Visit (next face to face is at 28 weeks ).  Future Appointments  Date Time Provider [place]  04/04/2019  8:45 AM [place] [facility]  04/25/2019 11:00 AM Aletha Halim, MD [facility]    Verita Schneiders, MD

## 2019-04-04 ENCOUNTER — Other Ambulatory Visit: Payer: Self-pay

## 2019-04-04 ENCOUNTER — Ambulatory Visit (HOSPITAL_COMMUNITY)
Admission: RE | Admit: 2019-04-04 | Discharge: 2019-04-04 | Disposition: A | Discharge status: Home / Self Care | Payer: Medicaid Other | Source: Ambulatory Visit | Attending: Obstetrics and Gynecology | Admitting: Obstetrics and Gynecology

## 2019-04-04 DIAGNOSIS — Z3400 Encounter for supervision of normal first pregnancy, unspecified trimester: Secondary | ICD-10-CM | POA: Diagnosis not present

## 2019-04-04 DIAGNOSIS — Z363 Encounter for antenatal screening for malformations: Secondary | ICD-10-CM

## 2019-04-04 DIAGNOSIS — Z3A18 18 weeks gestation of pregnancy: Secondary | ICD-10-CM

## 2019-04-25 ENCOUNTER — Encounter: Payer: Self-pay | Admitting: Obstetrics and Gynecology

## 2019-04-25 ENCOUNTER — Encounter: Payer: Self-pay | Admitting: General Practice

## 2019-04-25 ENCOUNTER — Telehealth: Payer: Self-pay | Admitting: Obstetrics and Gynecology

## 2019-04-25 ENCOUNTER — Telehealth: Payer: Self-pay | Admitting: General Practice

## 2019-04-25 NOTE — Progress Notes (Signed)
Patient did not keep her OB appointment for 04/25/2019.  Durene Romans MD Attending Center for [employer] farm manager)

## 2019-04-25 NOTE — Telephone Encounter (Signed)
Called patient (2x) to check in for Mychart video appointment, but no answer.  Unable to leave a voice mail due to VM not set up.

## 2019-05-03 ENCOUNTER — Encounter: Payer: Self-pay | Admitting: *Deleted

## 2019-05-16 ENCOUNTER — Telehealth (INDEPENDENT_AMBULATORY_CARE_PROVIDER_SITE_OTHER): Payer: Medicaid Other | Admitting: Family Medicine

## 2019-05-16 ENCOUNTER — Encounter: Payer: Self-pay | Admitting: Family Medicine

## 2019-05-16 ENCOUNTER — Other Ambulatory Visit: Payer: Self-pay

## 2019-05-16 VITALS — BP 128/81

## 2019-05-16 DIAGNOSIS — Z3402 Encounter for supervision of normal first pregnancy, second trimester: Secondary | ICD-10-CM

## 2019-05-16 DIAGNOSIS — Z3A24 24 weeks gestation of pregnancy: Secondary | ICD-10-CM

## 2019-05-16 NOTE — Progress Notes (Signed)
I connected with  Gabrielle Fernandez on 05/16/19 at  2:45 PM EDT by telephone and verified that I am speaking with the correct person using two identifiers.   I discussed the limitations, risks, security and privacy concerns of performing an evaluation and management service by telephone and the availability of in person appointments. I also discussed with the patient that there may be a patient responsible charge related to this service. The patient expressed understanding and agreed to proceed.  Crosby Oyster, RN 05/16/2019  2:47 PM

## 2019-05-16 NOTE — Progress Notes (Signed)
    TELEHEALTH OBSTETRICS PRENATAL VIRTUAL VIDEO VISIT ENCOUNTER NOTE  Provider location: [facility] for [place] [facility]   I connected with Sherald Hess on 05/16/19 at  2:45 PM EDT by MyChart Video Encounter at home and verified that I am speaking with the correct person using two identifiers.   I discussed the limitations, risks, security and privacy concerns of performing an evaluation and management service virtually and the availability of in person appointments. I also discussed with the patient that there may be a patient responsible charge related to this service. The patient expressed understanding and agreed to proceed. Subjective:  LENNIE DUNNIGAN is a 23 y.o. G1P0 at [redacted]w[redacted]d being seen today for ongoing prenatal care.  She is currently monitored for the following issues for this low-risk pregnancy and has Supervision of normal pregnancy on their problem list.  Patient reports no complaints.  Contractions: Not present. Vag. Bleeding: None.  Movement: Present. Denies any leaking of fluid.   The following portions of the patient's history were reviewed and updated as appropriate: allergies, current medications, past family history, past medical history, past social history, past surgical history and problem list.   Objective:   Vitals:   05/16/19 1446  BP: 128/81    Fetal Status:     Movement: Present     General:  Alert, oriented and cooperative. Patient is in no acute distress.  Respiratory: Normal respiratory effort, no problems with respiration noted  Mental Status: Normal mood and affect. Normal behavior. Normal judgment and thought content.  Rest of physical exam deferred due to type of encounter  Imaging: No results found.  Assessment and Plan:  Pregnancy: G1P0 at [redacted]w[redacted]d 1. Encounter for supervision of normal first pregnancy in second trimester Continue routine prenatal care. Needs to have dental work--ok can get letter if needed.  Preterm  labor symptoms and general obstetric precautions including but not limited to vaginal bleeding, contractions, leaking of fluid and fetal movement were reviewed in detail with the patient. I discussed the assessment and treatment plan with the patient. The patient was provided an opportunity to ask questions and all were answered. The patient agreed with the plan and demonstrated an understanding of the instructions. The patient was advised to call back or seek an in-person office evaluation/go to MAU at [facility] for any urgent or concerning symptoms. Please refer to After Visit Summary for other counseling recommendations.   I provided 12 minutes of face-to-face time during this encounter.  Return in 4 weeks (on 06/13/2019) for in person, 28 wk labs, [facility].  Future Appointments  Date Time Provider [place]  06/13/2019  8:45 AM Anyanwu, Sallyanne Havers, MD [facility]    Donnamae Jude, MD [facility], [place]

## 2019-05-16 NOTE — Patient Instructions (Signed)

## 2019-06-09 ENCOUNTER — Encounter: Payer: Self-pay | Admitting: Emergency Medicine

## 2019-06-09 ENCOUNTER — Ambulatory Visit
Admission: EM | Admit: 2019-06-09 | Discharge: 2019-06-09 | Disposition: A | Discharge status: Home / Self Care | Payer: Medicaid Other | Attending: Family Medicine | Admitting: Family Medicine

## 2019-06-09 ENCOUNTER — Other Ambulatory Visit: Payer: Self-pay

## 2019-06-09 DIAGNOSIS — J069 Acute upper respiratory infection, unspecified: Secondary | ICD-10-CM | POA: Insufficient documentation

## 2019-06-09 DIAGNOSIS — B9789 Other viral agents as the cause of diseases classified elsewhere: Secondary | ICD-10-CM | POA: Insufficient documentation

## 2019-06-09 DIAGNOSIS — Z20828 Contact with and (suspected) exposure to other viral communicable diseases: Secondary | ICD-10-CM | POA: Diagnosis not present

## 2019-06-09 LAB — RAPID STREP SCREEN (MED CTR MEBANE ONLY): Streptococcus, Group A Screen (Direct): NEGATIVE

## 2019-06-09 NOTE — ED Triage Notes (Signed)
Patient c/o sore throat, cough and headache that started yesterday.  Patient denies fevers.

## 2019-06-09 NOTE — Discharge Instructions (Signed)
Rest, fluids, self-isolate

## 2019-06-09 NOTE — ED Provider Notes (Signed)
[facility]    CSN: [id number] Arrival date & time: 06/09/19  1100      History   Chief Complaint Chief Complaint  Patient presents with  . Sore Throat  . Cough  . Headache    HPI Gabrielle Fernandez is a 23 y.o. female.   23 yo female with a c/o sore throat, cough, sinus congestion and headache since yesterday. Denies any fevers, chills, vomiting, earache. States she has not taken any medications. Denies any known sick contacts or travel.   Patient states she's about [redacted] weeks pregnant.      Past Medical History:  Diagnosis Date  . Medical history non-contributory     Patient Active Problem List   Diagnosis Date Noted  . Supervision of normal pregnancy 01/03/2019    Past Surgical History:  Procedure Laterality Date  . APPENDECTOMY    . LAPAROSCOPIC APPENDECTOMY N/A 09/01/2016   Procedure: APPENDECTOMY LAPAROSCOPIC;  Surgeon: Henrene DodgeJose Piscoya, MD;  Location: [facility] ORS;  Service: General;  Laterality: N/A;    OB History    Gravida  1   Para      Term      Preterm      AB      Living        SAB      TAB      Ectopic      Multiple      Live Births               Home Medications    Prior to Admission medications   Medication Sig Start Date End Date Taking? Authorizing Provider  Prenatal Vit-Fe Fumarate-FA (PRENATAL MULTIVITAMIN) TABS tablet Take 1 tablet by mouth daily at 12 noon.   Yes [provider]  Blood Pressure Monitoring (BLOOD PRESSURE CUFF) MISC 1 Device by Does not apply route once a week. To be monitored weekly at home, please 02/28/19   Reva BoresPratt, Tanya S, MD  dicyclomine (BENTYL) 10 MG capsule Take 1 capsule (10 mg total) by mouth 3 (three) times daily as needed for up to 14 days for spasms. 12/22/17 01/05/18  Willy Eddyobinson, Patrick, MD  fluticasone (FLONASE) 50 MCG/ACT nasal spray Place 2 sprays into both nostrils daily. Patient not taking: Reported on 01/03/2019 11/19/18   Domenick GongMortenson, Ashley, MD  ibuprofen (ADVIL,MOTRIN) 600  MG tablet Take 1 tablet (600 mg total) by mouth every 6 (six) hours as needed. Patient not taking: Reported on 01/03/2019 11/19/18   Domenick GongMortenson, Ashley, MD    Family History Family History  Problem Relation Age of Onset  . Hypertension Mother     Social History Social History   Tobacco Use  . Smoking status: Never Smoker  . Smokeless tobacco: Never Used  Substance Use Topics  . Alcohol use: Not Currently  . Drug use: No     Allergies   Patient has no known allergies.   Review of Systems Review of Systems   Physical Exam Triage Vital Signs ED Triage Vitals  Enc Vitals Group     BP 06/09/19 1115 121/77     Pulse Rate 06/09/19 1115 90     Resp 06/09/19 1115 16     Temp 06/09/19 1115 98.3 F (36.8 C)     Temp Source 06/09/19 1115 Oral     SpO2 06/09/19 1115 100 %     Weight 06/09/19 1112 186 lb (84.4 kg)     Height 06/09/19 1112 5\' 7"  (1.702 m)     Head Circumference --  Peak Flow --      Pain Score 06/09/19 1112 5     Pain Loc --      Pain Edu? --      Excl. in [place]? --    No data found.  Updated Vital Signs BP 121/77 (BP Location: Left Arm)   Pulse 90   Temp 98.3 F (36.8 C) (Oral)   Resp 16   Ht 5\' 7"  (1.702 m)   Wt 84.4 kg   LMP 10/13/2018 (LMP Unknown)   SpO2 100%   BMI 29.13 kg/m   FHTs: 140s   Visual Acuity Right Eye Distance:   Left Eye Distance:   Bilateral Distance:    Right Eye Near:   Left Eye Near:    Bilateral Near:     Physical Exam Vitals signs and nursing note reviewed.  Constitutional:      General: She is not in acute distress.    Appearance: She is not ill-appearing, toxic-appearing or diaphoretic.  HENT:     Right Ear: Tympanic membrane normal.     Left Ear: Tympanic membrane normal.     Nose: No congestion or rhinorrhea.     Mouth/Throat:     Pharynx: Posterior oropharyngeal erythema present. No oropharyngeal exudate.  Eyes:     General:        Right eye: No discharge.        Left eye: No discharge.  Neck:      Musculoskeletal: Normal range of motion and neck supple.  Cardiovascular:     Rate and Rhythm: Normal rate and regular rhythm.     Pulses: Normal pulses.     Heart sounds: Normal heart sounds.  Pulmonary:     Effort: Pulmonary effort is normal. No respiratory distress.     Breath sounds: Normal breath sounds. No stridor. No wheezing, rhonchi or rales.  Neurological:     Mental Status: She is alert.      UC Treatments / Results  Labs (all labs ordered are listed, but only abnormal results are displayed) Labs Reviewed  RAPID STREP SCREEN ([facility] ONLY)  NOVEL CORONAVIRUS, NAA (HOSP ORDER, SEND-OUT TO REF LAB; TAT 18-24 HRS)  CULTURE, GROUP A STREP [facility])    EKG   Radiology No results found.  Procedures Procedures (including critical care time)  Medications Ordered in UC Medications - No data to display  Initial Impression / Assessment and Plan / UC Course  I have reviewed the triage vital signs and the nursing notes.  Pertinent labs & imaging results that were available during my care of the patient were reviewed by me and considered in my medical decision making (see chart for details).      Final Clinical Impressions(s) / UC Diagnoses   Final diagnoses:  Viral URI with cough     Discharge Instructions     Rest, fluids, self-isolate    ED Prescriptions    None      1. Lab result (negative rapid strep) and diagnosis reviewed with patient 2. rx as per orders above; reviewed possible side effects, interactions, risks and benefits  3. Recommend supportive treatment as above 4. covid testing done 5. Follow-up prn if symptoms worsen or don't improve   Controlled Substance Prescriptions [place] Controlled Substance Registry consulted? Not Applicable   Norval Gable, MD 06/09/19 1255

## 2019-06-10 LAB — NOVEL CORONAVIRUS, NAA (HOSP ORDER, SEND-OUT TO REF LAB; TAT 18-24 HRS): SARS-CoV-2, NAA: NOT DETECTED

## 2019-06-12 ENCOUNTER — Encounter (HOSPITAL_COMMUNITY): Payer: Self-pay

## 2019-06-12 LAB — CULTURE, GROUP A STREP (THRC)

## 2019-06-13 ENCOUNTER — Other Ambulatory Visit: Payer: Self-pay

## 2019-06-13 ENCOUNTER — Ambulatory Visit (INDEPENDENT_AMBULATORY_CARE_PROVIDER_SITE_OTHER): Payer: Medicaid Other | Admitting: Obstetrics & Gynecology

## 2019-06-13 VITALS — BP 110/76 | HR 83 | Wt 192.0 lb

## 2019-06-13 DIAGNOSIS — Z3A28 28 weeks gestation of pregnancy: Secondary | ICD-10-CM

## 2019-06-13 DIAGNOSIS — Z23 Encounter for immunization: Secondary | ICD-10-CM | POA: Diagnosis not present

## 2019-06-13 DIAGNOSIS — Z3403 Encounter for supervision of normal first pregnancy, third trimester: Secondary | ICD-10-CM

## 2019-06-13 NOTE — Progress Notes (Signed)
   PRENATAL VISIT NOTE  Subjective:  Gabrielle Fernandez is a 23 y.o. G1P0 at [redacted]w[redacted]d being seen today for ongoing prenatal care.  She is currently monitored for the following issues for this low-risk pregnancy and has Supervision of normal pregnancy on their problem list.  Patient reports no complaints.  Contractions: Not present. Vag. Bleeding: None.  Movement: Present. Denies leaking of fluid.   The following portions of the patient's history were reviewed and updated as appropriate: allergies, current medications, past family history, past medical history, past social history, past surgical history and problem list.   Objective:   Vitals:   06/13/19 0854  BP: 110/76  Pulse: 83  Weight: 192 lb (87.1 kg)    Fetal Status: Fetal Heart Rate (bpm): 155 Fundal Height: 28 cm Movement: Present     General:  Alert, oriented and cooperative. Patient is in no acute distress.  Skin: Skin is warm and dry. No rash noted.   Cardiovascular: Normal heart rate noted  Respiratory: Normal respiratory effort, no problems with respiration noted  Abdomen: Soft, gravid, appropriate for gestational age.  Pain/Pressure: Absent     Pelvic: Cervical exam deferred        Extremities: Normal range of motion.  Edema: Trace  Mental Status: Normal mood and affect. Normal behavior. Normal judgment and thought content.   Assessment and Plan:  Pregnancy: G1P0 at [redacted]w[redacted]d 1. Encounter for supervision of normal first pregnancy in third trimester Third trimester labs today. Received Tdap today, declined Flu vaccine. - Glucose Tolerance, 2 Hours w/1 Hour - CBC - RPR - HIV Antibody (routine testing w rflx) - Tdap vaccine greater than or equal to 7yo IM Preterm labor symptoms and general obstetric precautions including but not limited to vaginal bleeding, contractions, leaking of fluid and fetal movement were reviewed in detail with the patient. Please refer to After Visit Summary for other counseling recommendations.   Return in about 2 weeks (around 06/27/2019) for Virtual OB Visit.  No future appointments.  Verita Schneiders, MD

## 2019-06-13 NOTE — Patient Instructions (Signed)
Return to office for any scheduled appointments. Call the office or go to the [facility] at [facility] [place] if:  You begin to have strong, frequent contractions  Your water breaks.  Sometimes it is a big gush of fluid, sometimes it is just a trickle that keeps getting your panties wet or running down your legs  You have vaginal bleeding.  It is normal to have a small amount of spotting if your cervix was checked.   You do not feel your baby moving like normal.  If you do not, get something to eat and drink and lay down and focus on feeling your baby move.   If your baby is still not moving like normal, you should call the office or go to MAU.  Any other obstetric concerns.   

## 2019-06-14 LAB — RPR: RPR Ser Ql: NONREACTIVE

## 2019-06-14 LAB — CBC
Hematocrit: 33.6 % — ABNORMAL LOW (ref 34.0–46.6)
Hemoglobin: 11.3 g/dL (ref 11.1–15.9)
MCH: 30 pg (ref 26.6–33.0)
MCHC: 33.6 g/dL (ref 31.5–35.7)
MCV: 89 fL (ref 79–97)
Platelets: 259 10*3/uL (ref 150–450)
RBC: 3.77 x10E6/uL (ref 3.77–5.28)
RDW: 11.8 % (ref 11.7–15.4)
WBC: 8.3 10*3/uL (ref 3.4–10.8)

## 2019-06-14 LAB — GLUCOSE TOLERANCE, 2 HOURS W/ 1HR
Glucose, 1 hour: 96 mg/dL (ref 65–179)
Glucose, 2 hour: 110 mg/dL (ref 65–152)
Glucose, Fasting: 67 mg/dL (ref 65–91)

## 2019-06-14 LAB — HIV ANTIBODY (ROUTINE TESTING W REFLEX): HIV Screen 4th Generation wRfx: NONREACTIVE

## 2019-06-27 ENCOUNTER — Telehealth: Payer: Medicaid Other | Admitting: Obstetrics & Gynecology

## 2019-07-04 ENCOUNTER — Telehealth (INDEPENDENT_AMBULATORY_CARE_PROVIDER_SITE_OTHER): Payer: Medicaid Other | Admitting: Obstetrics & Gynecology

## 2019-07-04 ENCOUNTER — Encounter: Payer: Self-pay | Admitting: Obstetrics & Gynecology

## 2019-07-04 ENCOUNTER — Other Ambulatory Visit: Payer: Self-pay

## 2019-07-04 DIAGNOSIS — Z3403 Encounter for supervision of normal first pregnancy, third trimester: Secondary | ICD-10-CM

## 2019-07-04 NOTE — Progress Notes (Signed)
I connected with  Logann Whitebread Poulsen on 07/04/19 at  3:45 PM EDT by telephone and verified that I am speaking with the correct person using two identifiers.   I discussed the limitations, risks, security and privacy concerns of performing an evaluation and management service by telephone and the availability of in person appointments. I also discussed with the patient that there may be a patient responsible charge related to this service. The patient expressed understanding and agreed to proceed.  Jacobey Gura Jeanella Anton, [place] 07/04/2019  3:34 PM

## 2019-07-04 NOTE — Progress Notes (Signed)
   TELEHEALTH OBSTETRICS PRENATAL VIRTUAL VIDEO VISIT ENCOUNTER NOTE  Provider location: [facility] for [place] [facility]   I connected with Gabrielle Fernandez on 07/04/19 at  3:45 PM EDT by MyChart Video Encounter at home and verified that I am speaking with the correct person using two identifiers.   I discussed the limitations, risks, security and privacy concerns of performing an evaluation and management service virtually and the availability of in person appointments. I also discussed with the patient that there may be a patient responsible charge related to this service. The patient expressed understanding and agreed to proceed. Subjective:  Gabrielle Fernandez is a 23 y.o. G1P0 at [redacted]w[redacted]d being seen today for ongoing prenatal care.  She is currently monitored for the following issues for this low-risk pregnancy and has Supervision of normal pregnancy on their problem list.  Patient reports no complaints.  Contractions: Not present. Vag. Bleeding: None.  Movement: Present. Denies any leaking of fluid.   The following portions of the patient's history were reviewed and updated as appropriate: allergies, current medications, past family history, past medical history, past social history, past surgical history and problem list.   Objective:   Vitals:   07/04/19 1540  BP: 127/78  Pulse: 74    Fetal Status:     Movement: Present     General:  Alert, oriented and cooperative. Patient is in no acute distress.  Respiratory: Normal respiratory effort, no problems with respiration noted  Mental Status: Normal mood and affect. Normal behavior. Normal judgment and thought content.  Rest of physical exam deferred due to type of encounter  Imaging: No results found.  Assessment and Plan:  Pregnancy: G1P0 at [redacted]w[redacted]d 1. Encounter for supervision of normal first pregnancy in third trimester Reviewed normal third trimester lab results. No other concerns. Preterm labor symptoms and  general obstetric precautions including but not limited to vaginal bleeding, contractions, leaking of fluid and fetal movement were reviewed in detail with the patient. I discussed the assessment and treatment plan with the patient. The patient was provided an opportunity to ask questions and all were answered. The patient agreed with the plan and demonstrated an understanding of the instructions. The patient was advised to call back or seek an in-person office evaluation/go to MAU at [facility] for any urgent or concerning symptoms. Please refer to After Visit Summary for other counseling recommendations.   I provided 10 minutes of face-to-face time during this encounter.  Return in about 3 weeks (around 07/25/2019) for Virtual OB Visit   5 weeks from now: OFFICE OB Visit, Pelvic cultures.  Future Appointments  Date Time Provider [place]  07/18/2019  3:15 PM Aletha Halim, MD [facility]    Verita Schneiders, MD [facility] for [facility] [place]

## 2019-07-04 NOTE — Patient Instructions (Signed)
Return to office for any scheduled appointments. Call the office or go to the MAU at [facility] [place] if:  You begin to have strong, frequent contractions  Your water breaks.  Sometimes it is a big gush of fluid, sometimes it is just a trickle that keeps getting your panties wet or running down your legs  You have vaginal bleeding.  It is normal to have a small amount of spotting if your cervix was checked.   You do not feel your baby moving like normal.  If you do not, get something to eat and drink and lay down and focus on feeling your baby move.   If your baby is still not moving like normal, you should call the office or go to MAU.  Any other obstetric concerns.   

## 2019-07-18 ENCOUNTER — Telehealth: Payer: Medicaid Other | Admitting: Obstetrics and Gynecology

## 2019-07-18 ENCOUNTER — Telehealth: Payer: Self-pay | Admitting: Radiology

## 2019-07-18 NOTE — Telephone Encounter (Signed)
Called and left message for patient to call [facility]. Patient had a virtual visit with Dr Ilda Basset

## 2019-07-24 ENCOUNTER — Other Ambulatory Visit: Payer: Self-pay

## 2019-07-24 ENCOUNTER — Inpatient Hospital Stay (HOSPITAL_COMMUNITY)
Admission: AD | Admit: 2019-07-24 | Discharge: 2019-07-24 | Disposition: A | Discharge status: Home / Self Care | Payer: Medicaid Other | Attending: Obstetrics & Gynecology | Admitting: Obstetrics & Gynecology

## 2019-07-24 ENCOUNTER — Encounter (HOSPITAL_COMMUNITY): Payer: Self-pay

## 2019-07-24 DIAGNOSIS — R109 Unspecified abdominal pain: Secondary | ICD-10-CM | POA: Insufficient documentation

## 2019-07-24 DIAGNOSIS — O26893 Other specified pregnancy related conditions, third trimester: Secondary | ICD-10-CM | POA: Insufficient documentation

## 2019-07-24 DIAGNOSIS — R102 Pelvic and perineal pain: Secondary | ICD-10-CM

## 2019-07-24 DIAGNOSIS — Z3689 Encounter for other specified antenatal screening: Secondary | ICD-10-CM

## 2019-07-24 DIAGNOSIS — O26899 Other specified pregnancy related conditions, unspecified trimester: Secondary | ICD-10-CM

## 2019-07-24 DIAGNOSIS — Z3A34 34 weeks gestation of pregnancy: Secondary | ICD-10-CM | POA: Diagnosis not present

## 2019-07-24 LAB — URINALYSIS, ROUTINE W REFLEX MICROSCOPIC
Bilirubin Urine: NEGATIVE
Glucose, UA: NEGATIVE mg/dL
Hgb urine dipstick: NEGATIVE
Ketones, ur: NEGATIVE mg/dL
Leukocytes,Ua: NEGATIVE
Nitrite: NEGATIVE
Protein, ur: NEGATIVE mg/dL
Specific Gravity, Urine: 1.006 (ref 1.005–1.030)
pH: 7 (ref 5.0–8.0)

## 2019-07-24 LAB — WET PREP, GENITAL
Clue Cells Wet Prep HPF POC: NONE SEEN
Sperm: NONE SEEN
Trich, Wet Prep: NONE SEEN
Yeast Wet Prep HPF POC: NONE SEEN

## 2019-07-24 LAB — CULTURE, OB URINE: Culture: NO GROWTH

## 2019-07-24 LAB — FETAL FIBRONECTIN: Fetal Fibronectin: NEGATIVE

## 2019-07-24 MED ORDER — COMFORT FIT MATERNITY SUPP SM MISC: 1.0000 [IU] | Freq: Every day | 0 refills | Status: DC | PRN

## 2019-07-24 NOTE — MAU Note (Signed)
Pt reports to MAU for cramping that started 2 and a half days ago, pt states it  originated in back and has moved around to the front, the pain feels sharp, denies bleeding, denies leaking, confirms fetal movement.

## 2019-07-24 NOTE — Discharge Instructions (Signed)
° °PREGNANCY SUPPORT BELT: °You are not alone, Seventy-five percent of women have some sort of abdominal or back pain at some point in their pregnancy. Your baby is growing at a fast pace, which means that your whole body is rapidly trying to adjust to the changes. As your uterus grows, your back may start feeling a bit under stress and this can result in back or abdominal pain that can go from mild, and therefore bearable, to severe pains that will not allow you to sit or lay down comfortably, When it comes to dealing with pregnancy-related pains and cramps, some pregnant women usually prefer natural remedies, which the market is filled with nowadays. For example, wearing a pregnancy support belt can help ease and lessen your discomfort and pain. °WHAT ARE THE BENEFITS OF WEARING A PREGNANCY SUPPORT BELT? A pregnancy support belt provides support to the lower portion of the belly taking some of the weight of the growing uterus and distributing to the other parts of your body. It is designed make you comfortable and gives you extra support. Over the years, the pregnancy apparel market has been studying the needs and wants of pregnant women and they have come up with the most comfortable pregnancy support belts that woman could ever ask for. In fact, you will no longer have to wear a stretched-out or bulky pregnancy belt that is visible underneath your clothes and makes you feel even more uncomfortable. Nowadays, a pregnancy support belt is made of comfortable and stretchy materials that will not irritate your skin but will actually make you feel at ease and you will not even notice you are wearing it. They are easy to put on and adjust during the day and can be worn at night for additional support.  °BENEFITS: °• Relives Back pain °• Relieves Abdominal Muscle and Leg Pain °• Stabilizes the Pelvic Ring °• Offers a Cushioned Abdominal Lift Pad °• Relieves pressure on the Sciatic Nerve Within Minutes °WHERE TO GET  YOUR PREGNANCY BELT: [employer] [phone] @[address] [place], [place] [address] ° ° ° °Preterm Labor and Birth Information ° °The normal length of a pregnancy is 39-41 weeks. Preterm labor is when labor starts before 37 completed weeks of pregnancy. °What are the risk factors for preterm labor? °Preterm labor is more likely to occur in women who: °· Have certain infections during pregnancy such as a bladder infection, sexually transmitted infection, or infection inside the uterus (chorioamnionitis). °· Have a shorter-than-normal cervix. °· Have gone into preterm labor before. °· Have had surgery on their cervix. °· Are younger than age 17 or older than age 35. °· Are African American. °· Are pregnant with twins or multiple babies (multiple gestation). °· Take street drugs or smoke while pregnant. °· Do not gain enough weight while pregnant. °· Became pregnant shortly after having been pregnant. °What are the symptoms of preterm labor? °Symptoms of preterm labor include: °· Cramps similar to those that can happen during a menstrual period. The cramps may happen with diarrhea. °· Pain in the abdomen or lower back. °· Regular uterine contractions that may feel like tightening of the abdomen. °· A feeling of increased pressure in the pelvis. °· Increased watery or bloody mucus discharge from the vagina. °· Water breaking (ruptured amniotic sac). °Why is it important to recognize signs of preterm labor? °It is important to recognize signs of preterm labor because babies who are born prematurely may not be fully developed. This can   put them at an increased risk for: °· Long-term (chronic) heart and lung problems. °· Difficulty immediately after birth with regulating body systems, including blood sugar, body temperature, heart rate, and breathing rate. °· Bleeding in the brain. °· Cerebral palsy. °· Learning difficulties. °· Death. °These risks are highest for babies who are born before 34 weeks  of pregnancy. °How is preterm labor treated? °Treatment depends on the length of your pregnancy, your condition, and the health of your baby. It may involve: °· Having a stitch (suture) placed in your cervix to prevent your cervix from opening too early (cerclage). °· Taking or being given medicines, such as: °? Hormone medicines. These may be given early in pregnancy to help support the pregnancy. °? Medicine to stop contractions. °? Medicines to help mature the baby’s lungs. These may be prescribed if the risk of delivery is high. °? Medicines to prevent your baby from developing cerebral palsy. °If the labor happens before 34 weeks of pregnancy, you may need to stay in the hospital. °What should I do if I think I am in preterm labor? °If you think that you are going into preterm labor, call your health care provider right away. °How can I prevent preterm labor in future pregnancies? °To increase your chance of having a full-term pregnancy: °· Do not use any tobacco products, such as cigarettes, chewing tobacco, and e-cigarettes. If you need help quitting, ask your health care provider. °· Do not use street drugs or medicines that have not been prescribed to you during your pregnancy. °· Talk with your health care provider before taking any herbal supplements, even if you have been taking them regularly. °· Make sure you gain a healthy amount of weight during your pregnancy. °· Watch for infection. If you think that you might have an infection, get it checked right away. °· Make sure to tell your health care provider if you have gone into preterm labor before. °This information is not intended to replace advice given to you by your health care provider. Make sure you discuss any questions you have with your health care provider. °Document Released: 12/12/2003 Document Revised: 01/13/2019 Document Reviewed: 02/12/2016 °Elsevier Patient Education © 2020 Elsevier Inc[employer] °Round Ligament Pain ° °The round ligament is a  cord of muscle and tissue that helps support the uterus. It can become a source of pain during pregnancy if it becomes stretched or twisted as the baby grows. The pain usually begins in the second trimester (13-28 weeks) of pregnancy, and it can come and go until the baby is delivered. It is not a serious problem, and it does not cause harm to the baby. °Round ligament pain is usually a short, sharp, and pinching pain, but it can also be a dull, lingering, and aching pain. The pain is felt in the lower side of the abdomen or in the groin. It usually starts deep in the groin and moves up to the outside of the hip area. The pain may occur when you: °· Suddenly change position, such as quickly going from a sitting to standing position. °· Roll over in bed. °· Cough or sneeze. °· Do physical activity. °Follow these instructions at home: ° °· Watch your condition for any changes. °· When the pain starts, relax. Then try any of these methods to help with the pain: °? Sitting down. °? Flexing your knees up to your abdomen. °? Lying on your side with one pillow under your abdomen and another pillow between your   legs. °? Sitting in a warm bath for 15-20 minutes or until the pain goes away. °· Take over-the-counter and prescription medicines only as told by your health care provider. °· Move slowly when you sit down or stand up. °· Avoid long walks if they cause pain. °· Stop or reduce your physical activities if they cause pain. °· Keep all follow-up visits as told by your health care provider. This is important. °Contact a health care provider if: °· Your pain does not go away with treatment. °· You feel pain in your back that you did not have before. °· Your medicine is not helping. °Get help right away if: °· You have a fever or chills. °· You develop uterine contractions. °· You have vaginal bleeding. °· You have nausea or vomiting. °· You have diarrhea. °· You have pain when you urinate. °Summary °· Round ligament pain  is felt in the lower abdomen or groin. It is usually a short, sharp, and pinching pain. It can also be a dull, lingering, and aching pain. °· This pain usually begins in the second trimester (13-28 weeks). It occurs because the uterus is stretching with the growing baby, and it is not harmful to the baby. °· You may notice the pain when you suddenly change position, when you cough or sneeze, or during physical activity. °· Relaxing, flexing your knees to your abdomen, lying on one side, or taking a warm bath may help to get rid of the pain. °· Get help from your health care provider if the pain does not go away or if you have vaginal bleeding, nausea, vomiting, diarrhea, or painful urination. °This information is not intended to replace advice given to you by your health care provider. Make sure you discuss any questions you have with your health care provider. °Document Released: 06/30/2008 Document Revised: 03/09/2018 Document Reviewed: 03/09/2018 °Elsevier Patient Education © 2020 Elsevier Inc. ° °

## 2019-07-24 NOTE — MAU Provider Note (Signed)
History     CSN: [id number]  Arrival date and time: 07/24/19 0002   First Provider Initiated Contact with Patient 07/24/19 0143      Chief Complaint  Patient presents with  . Contractions   Ms. Gabrielle Fernandez is a 23 y.o. G1P0 at [redacted]w[redacted]d who presents to MAU for "period-like cramps". Pt denies anything in the vagina in the past 24hrs.  Onset: 2.5days ago Location: pelvis Duration: 2.5days Character: period-like cramps with sharp pains across pelvis, pt reports she was having "back pain like period cramps" but is not happening at this time Aggravating/Associated: none/none Relieving: none Treatment: stretching, drinking water - didn't work Severity: 7/10  Pt denies VB, LOF, ctx, decreased FM, vaginal discharge/odor/itching. Pt denies N/V, abdominal pain, constipation, diarrhea, or urinary problems. Pt denies fever, chills, fatigue, sweating or changes in appetite. Pt denies SOB or chest pain. Pt denies dizziness, HA, light-headedness, weakness.  Problems this pregnancy include: none. Allergies? NKDA Current medications/supplements? PNVs Prenatal care provider? [facility] Richardson, needs to call to schedule 35/36wk appt today   OB History    Gravida  1   Para      Term      Preterm      AB      Living        SAB      TAB      Ectopic      Multiple      Live Births              Past Medical History:  Diagnosis Date  . Medical history non-contributory     Past Surgical History:  Procedure Laterality Date  . APPENDECTOMY    . LAPAROSCOPIC APPENDECTOMY N/A 09/01/2016   Procedure: APPENDECTOMY LAPAROSCOPIC;  Surgeon: Olean Ree, MD;  Location: [facility] ORS;  Service: General;  Laterality: N/A;    Family History  Problem Relation Age of Onset  . Hypertension Mother   . Hypertension Father     Social History   Tobacco Use  . Smoking status: Never Smoker  . Smokeless tobacco: Never Used  Substance Use Topics  . Alcohol use: Not Currently  . Drug  use: No    Allergies: No Known Allergies  Medications Prior to Admission  Medication Sig Dispense Refill Last Dose  . Blood Pressure Monitoring (BLOOD PRESSURE CUFF) MISC 1 Device by Does not apply route once a week. To be monitored weekly at home, please 1 each 0 07/23/2019 at Unknown time  . Prenatal Vit-Fe Fumarate-FA (PRENATAL MULTIVITAMIN) TABS tablet Take 1 tablet by mouth daily at 12 noon.   07/23/2019 at Unknown time  . dicyclomine (BENTYL) 10 MG capsule Take 1 capsule (10 mg total) by mouth 3 (three) times daily as needed for up to 14 days for spasms. 16 capsule 0   . fluticasone (FLONASE) 50 MCG/ACT nasal spray Place 2 sprays into both nostrils daily. (Patient not taking: Reported on 01/03/2019) 16 g 0   . ibuprofen (ADVIL,MOTRIN) 600 MG tablet Take 1 tablet (600 mg total) by mouth every 6 (six) hours as needed. (Patient not taking: Reported on 01/03/2019) 30 tablet 0     Review of Systems  Constitutional: Negative for chills, diaphoresis, fatigue and fever.  Eyes: Negative for visual disturbance.  Respiratory: Negative for shortness of breath.   Cardiovascular: Negative for chest pain.  Gastrointestinal: Negative for abdominal pain, constipation, diarrhea, nausea and vomiting.  Genitourinary: Positive for pelvic pain. Negative for dysuria, flank pain, frequency, urgency, vaginal bleeding and  vaginal discharge.  Musculoskeletal: Positive for back pain.  Neurological: Negative for dizziness, weakness, light-headedness and headaches.   Physical Exam   Blood pressure 139/83, pulse 81, temperature 97.7 F (36.5 C), temperature source Oral, resp. rate 16, last menstrual period 10/13/2018, SpO2 99 %.  Patient Vitals for the past 24 hrs:  BP Temp Temp src Pulse Resp SpO2  07/24/19 0300 139/83 - - 81 - 99 %  07/24/19 0130 127/90 97.7 F (36.5 C) Oral 80 16 -   Physical Exam  Constitutional: She is oriented to person, place, and time. She appears well-developed and well-nourished.  No distress.  HENT:  Head: Normocephalic and atraumatic.  Respiratory: Effort normal.  GI: Soft. She exhibits no distension and no mass. There is no abdominal tenderness. There is no rebound and no guarding.  Genitourinary:    Genitourinary Comments: CE: long/closed/posterior   Neurological: She is alert and oriented to person, place, and time.  Skin: Skin is warm and dry. She is not diaphoretic.  Psychiatric: She has a normal mood and affect. Her behavior is normal. Judgment and thought content normal.   Results for orders placed or performed during the hospital encounter of 07/24/19 (from the past 24 hour(s))  Fetal fibronectin     Status: None   Collection Time: 07/24/19  1:54 AM  Result Value Ref Range   Fetal Fibronectin NEGATIVE NEGATIVE  Wet prep, genital     Status: Abnormal   Collection Time: 07/24/19  1:54 AM   Specimen: PATH Cytology Cervicovaginal Ancillary Only  Result Value Ref Range   Yeast Wet Prep HPF POC NONE SEEN NONE SEEN   Trich, Wet Prep NONE SEEN NONE SEEN   Clue Cells Wet Prep HPF POC NONE SEEN NONE SEEN   WBC, Wet Prep HPF POC MANY (A) NONE SEEN   Sperm NONE SEEN    MAU Course  Procedures  MDM -r/o PTL -CE on admission: long/closed/posterior -UA: pending at time of discharge -urine culture ordered -fFN: negative -WetPrep: many WBCs, otherwise WNL -GC/CT collected -EFM: reactive       -baseline: 120       -variability: moderate       -accels: present, 15x15       -decels: single variable       -TOCO: no ctx -pt offered medication for pelvic pain in MAU, pt decliens stating she would like to speak with her OB first -pt reports she does not have a pregnancy belt at home, RX given -pt discharged to home in stable condition  Orders Placed This Encounter  Procedures  . Wet prep, genital    Standing Status:   Standing    Number of Occurrences:   1  . Culture, OB Urine    Standing Status:   Standing    Number of Occurrences:   1  . Fetal  fibronectin    Standing Status:   Standing    Number of Occurrences:   1  . Urinalysis, Routine w reflex microscopic    Standing Status:   Standing    Number of Occurrences:   1  . Discharge patient    Order Specific Question:   Discharge disposition    Answer:   01-Home or Self Care [1]    Order Specific Question:   Discharge patient date    Answer:   07/24/2019   Meds ordered this encounter  Medications  . Elastic Bandages & Supports (COMFORT FIT MATERNITY SUPP SM) MISC    Sig: 1 Units  by Does not apply route daily as needed.    Dispense:  1 each    Refill:  0    Order Specific Question:   Supervising Provider    Answer:   Duane LopeEURE, LUTHER H [[id number]]   Assessment and Plan   1. Pelvic cramping in antepartum period   2. [redacted] weeks gestation of pregnancy   3. NST (non-stress test) reactive    Allergies as of 07/24/2019   No Known Allergies     Medication List    STOP taking these medications   ibuprofen 600 MG tablet Commonly known as: ADVIL     TAKE these medications   Blood Pressure Cuff Misc 1 Device by Does not apply route once a week. To be monitored weekly at home, please   Comfort Fit Maternity Supp Sm Misc 1 Units by Does not apply route daily as needed.   dicyclomine 10 MG capsule Commonly known as: Bentyl Take 1 capsule (10 mg total) by mouth 3 (three) times daily as needed for up to 14 days for spasms.   fluticasone 50 MCG/ACT nasal spray Commonly known as: FLONASE Place 2 sprays into both nostrils daily.   prenatal multivitamin Tabs tablet Take 1 tablet by mouth daily at 12 noon.      -will call with culture results, if positive -RX pregnancy belt -discussed s/sx of PTL -strict PTL/return MAU precautions given -pt discharged to home in stable condition  Joni Reiningicole E  07/24/2019, 3:28 AM

## 2019-08-09 ENCOUNTER — Other Ambulatory Visit: Payer: Self-pay

## 2019-08-09 ENCOUNTER — Ambulatory Visit (INDEPENDENT_AMBULATORY_CARE_PROVIDER_SITE_OTHER): Payer: Medicaid Other | Admitting: Family Medicine

## 2019-08-09 ENCOUNTER — Other Ambulatory Visit (HOSPITAL_COMMUNITY)
Admission: RE | Admit: 2019-08-09 | Discharge: 2019-08-09 | Disposition: A | Discharge status: Home / Self Care | Payer: Medicaid Other | Source: Ambulatory Visit | Attending: Family Medicine | Admitting: Family Medicine

## 2019-08-09 VITALS — BP 129/85 | HR 75 | Wt 207.0 lb

## 2019-08-09 DIAGNOSIS — Z3403 Encounter for supervision of normal first pregnancy, third trimester: Secondary | ICD-10-CM | POA: Diagnosis not present

## 2019-08-09 NOTE — Progress Notes (Signed)
   PRENATAL VISIT NOTE  Subjective:  Gabrielle Fernandez is a 23 y.o. G1P0 at [redacted]w[redacted]d being seen today for ongoing prenatal care.  She is currently monitored for the following issues for this low-risk pregnancy and has Supervision of normal pregnancy on their problem list.  Patient reports no complaints.  Contractions: Not present. Vag. Bleeding: None.  Movement: Present. Denies leaking of fluid.   The following portions of the patient's history were reviewed and updated as appropriate: allergies, current medications, past family history, past medical history, past social history, past surgical history and problem list.   Objective:   Vitals:   08/09/19 1427  BP: 129/85  Pulse: 75  Weight: 207 lb (93.9 kg)    Fetal Status: Fetal Heart Rate (bpm): 137 Fundal Height: 36 cm Movement: Present  Presentation: Vertex  General:  Alert, oriented and cooperative. Patient is in no acute distress.  Skin: Skin is warm and dry. No rash noted.   Cardiovascular: Normal heart rate noted  Respiratory: Normal respiratory effort, no problems with respiration noted  Abdomen: Soft, gravid, appropriate for gestational age.  Pain/Pressure: Absent     Pelvic: Cervical exam deferred        Extremities: Normal range of motion.  Edema: Trace  Mental Status: Normal mood and affect. Normal behavior. Normal judgment and thought content.   Assessment and Plan:  Pregnancy: G1P0 at [redacted]w[redacted]d 1. Encounter for supervision of normal first pregnancy in third trimester Reviewed when to seek care for labor - Culture, beta strep (group b only) - GC/Chlamydia probe amp ([place])not at [facility]  Preterm labor symptoms and general obstetric precautions including but not limited to vaginal bleeding, contractions, leaking of fluid and fetal movement were reviewed in detail with the patient. Please refer to After Visit Summary for other counseling recommendations.   Return in about 1 week (around 08/16/2019) for Routine prenatal  care, Telehealth/Virtual health OB Visit.  Future Appointments  Date Time Provider [place]  08/16/2019  1:00 PM Darlina Rumpf, [place] [facility]  08/23/2019  2:15 PM Caren Macadam, MD [facility]  08/30/2019  3:00 PM Darlina Rumpf, CNM [facility]    Caren Macadam, MD

## 2019-08-11 LAB — GC/CHLAMYDIA PROBE AMP (~~LOC~~) NOT AT ARMC
Chlamydia: NEGATIVE
Comment: NEGATIVE
Comment: NORMAL
Neisseria Gonorrhea: NEGATIVE

## 2019-08-13 LAB — CULTURE, BETA STREP (GROUP B ONLY): Strep Gp B Culture: NEGATIVE

## 2019-08-16 ENCOUNTER — Telehealth (INDEPENDENT_AMBULATORY_CARE_PROVIDER_SITE_OTHER): Payer: Medicaid Other | Admitting: Advanced Practice Midwife

## 2019-08-16 ENCOUNTER — Other Ambulatory Visit: Payer: Self-pay

## 2019-08-16 VITALS — BP 144/88 | HR 76

## 2019-08-16 DIAGNOSIS — O26899 Other specified pregnancy related conditions, unspecified trimester: Secondary | ICD-10-CM

## 2019-08-16 DIAGNOSIS — R102 Pelvic and perineal pain: Secondary | ICD-10-CM

## 2019-08-16 DIAGNOSIS — Z3403 Encounter for supervision of normal first pregnancy, third trimester: Secondary | ICD-10-CM

## 2019-08-16 DIAGNOSIS — Z3A37 37 weeks gestation of pregnancy: Secondary | ICD-10-CM

## 2019-08-16 NOTE — Progress Notes (Signed)
I connected with  Gabrielle Fernandez on 08/16/19 at  1:00 PM EST by telephone and verified that I am speaking with the correct person using two identifiers.   I discussed the limitations, risks, security and privacy concerns of performing an evaluation and management service by telephone and the availability of in person appointments. I also discussed with the patient that there may be a patient responsible charge related to this service. The patient expressed understanding and agreed to proceed.  Crosby Oyster, RN 08/16/2019  1:02 PM

## 2019-08-16 NOTE — Progress Notes (Signed)
   TELEHEALTH VIRTUAL OBSTETRICS VISIT ENCOUNTER NOTE  I connected with Gabrielle Fernandez on 08/16/19 at  1:00 PM EST by telephone at home and verified that I am speaking with the correct person using two identifiers.   I discussed the limitations, risks, security and privacy concerns of performing an evaluation and management service by telephone and the availability of in person appointments. I also discussed with the patient that there may be a patient responsible charge related to this service. The patient expressed understanding and agreed to proceed.  Subjective:  Gabrielle Fernandez is a 23 y.o. G1P0 at [redacted]w[redacted]d being followed for ongoing prenatal care.  She is currently monitored for the following issues for this low-risk pregnancy and has Supervision of normal pregnancy on their problem list.  Patient reports occasional contractions. Reports fetal movement. Denies any contractions, bleeding or leaking of fluid.   The following portions of the patient's history were reviewed and updated as appropriate: allergies, current medications, past family history, past medical history, past social history, past surgical history and problem list.   Objective:   General:  Alert, oriented and cooperative.   Mental Status: Normal mood and affect perceived. Normal judgment and thought content.  Rest of physical exam deferred due to type of encounter  Assessment and Plan:  Pregnancy: G1P0 at [redacted]w[redacted]d  1. Encounter for supervision of normal first pregnancy in third trimester --No complaints or concerns mentioned today --Per patient request, reviewed typical milestones for remaining visits including data to support possible membrane sweep at 39 and 40 weeks  2. Pelvic cramping in antepartum period - Ongoing  Term labor symptoms and general obstetric precautions including but not limited to vaginal bleeding, contractions, leaking of fluid and fetal movement were reviewed in detail with the patient.  I  discussed the assessment and treatment plan with the patient. The patient was provided an opportunity to ask questions and all were answered. The patient agreed with the plan and demonstrated an understanding of the instructions. The patient was advised to call back or seek an in-person office evaluation/go to MAU at [facility] for any urgent or concerning symptoms. Please refer to After Visit Summary for other counseling recommendations.   I provided 7 minutes of non-face-to-face time during this encounter.  No follow-ups on file.  Future Appointments  Date Time Provider [place]  08/23/2019  2:15 PM Caren Macadam, MD [facility]  08/30/2019  3:00 PM Darlina Rumpf, CNM [facility]   Visit was originally scheduled for MyChart appointment but patient was unable to achieve connectivity at appointment time.  Darlina Rumpf, [place] for [employer], [place]

## 2019-08-23 ENCOUNTER — Inpatient Hospital Stay (HOSPITAL_COMMUNITY)
Admission: AD | Admit: 2019-08-23 | Discharge: 2019-08-27 | DRG: 807 | Disposition: A | Discharge status: Home / Self Care | Payer: Medicaid Other | Attending: Family Medicine | Admitting: Family Medicine

## 2019-08-23 ENCOUNTER — Encounter (HOSPITAL_COMMUNITY): Payer: Self-pay | Admitting: *Deleted

## 2019-08-23 ENCOUNTER — Telehealth (INDEPENDENT_AMBULATORY_CARE_PROVIDER_SITE_OTHER): Payer: Medicaid Other | Admitting: Family Medicine

## 2019-08-23 ENCOUNTER — Other Ambulatory Visit: Payer: Self-pay

## 2019-08-23 VITALS — BP 143/89 | HR 89

## 2019-08-23 DIAGNOSIS — Z8759 Personal history of other complications of pregnancy, childbirth and the puerperium: Secondary | ICD-10-CM | POA: Diagnosis present

## 2019-08-23 DIAGNOSIS — O134 Gestational [pregnancy-induced] hypertension without significant proteinuria, complicating childbirth: Secondary | ICD-10-CM | POA: Diagnosis present

## 2019-08-23 DIAGNOSIS — Z3A38 38 weeks gestation of pregnancy: Secondary | ICD-10-CM | POA: Diagnosis not present

## 2019-08-23 DIAGNOSIS — Z362 Encounter for other antenatal screening follow-up: Secondary | ICD-10-CM | POA: Diagnosis not present

## 2019-08-23 DIAGNOSIS — O133 Gestational [pregnancy-induced] hypertension without significant proteinuria, third trimester: Secondary | ICD-10-CM

## 2019-08-23 DIAGNOSIS — Z3403 Encounter for supervision of normal first pregnancy, third trimester: Secondary | ICD-10-CM

## 2019-08-23 DIAGNOSIS — Z20828 Contact with and (suspected) exposure to other viral communicable diseases: Secondary | ICD-10-CM | POA: Diagnosis present

## 2019-08-23 LAB — SARS CORONAVIRUS 2 (TAT 6-24 HRS): SARS Coronavirus 2: NEGATIVE

## 2019-08-23 LAB — CBC
HCT: 37.4 % (ref 36.0–46.0)
Hemoglobin: 12.4 g/dL (ref 12.0–15.0)
MCH: 29.9 pg (ref 26.0–34.0)
MCHC: 33.2 g/dL (ref 30.0–36.0)
MCV: 90.1 fL (ref 80.0–100.0)
Platelets: 185 10*3/uL (ref 150–400)
RBC: 4.15 MIL/uL (ref 3.87–5.11)
RDW: 13.7 % (ref 11.5–15.5)
WBC: 8.7 10*3/uL (ref 4.0–10.5)
nRBC: 0 % (ref 0.0–0.2)

## 2019-08-23 LAB — TYPE AND SCREEN
ABO/RH(D): A POS
Antibody Screen: NEGATIVE

## 2019-08-23 LAB — URINALYSIS, ROUTINE W REFLEX MICROSCOPIC
Bilirubin Urine: NEGATIVE
Glucose, UA: NEGATIVE mg/dL
Hgb urine dipstick: NEGATIVE
Ketones, ur: NEGATIVE mg/dL
Nitrite: NEGATIVE
Protein, ur: NEGATIVE mg/dL
Specific Gravity, Urine: 1.005 (ref 1.005–1.030)
pH: 7 (ref 5.0–8.0)

## 2019-08-23 LAB — PROTEIN / CREATININE RATIO, URINE
Creatinine, Urine: 41.69 mg/dL
Total Protein, Urine: <6 mg/dL

## 2019-08-23 LAB — COMPREHENSIVE METABOLIC PANEL
ALT: 16 U/L (ref 0–44)
AST: 22 U/L (ref 15–41)
Albumin: 3 g/dL — ABNORMAL LOW (ref 3.5–5.0)
Alkaline Phosphatase: 109 U/L (ref 38–126)
Anion gap: 12 (ref 5–15)
BUN: 7 mg/dL (ref 6–20)
CO2: 19 mmol/L — ABNORMAL LOW (ref 22–32)
Calcium: 8.8 mg/dL — ABNORMAL LOW (ref 8.9–10.3)
Chloride: 106 mmol/L (ref 98–111)
Creatinine, Ser: 0.52 mg/dL (ref 0.44–1.00)
GFR calc Af Amer: >60 mL/min (ref >60)
GFR calc non Af Amer: >60 mL/min (ref >60)
Glucose, Bld: 68 mg/dL — ABNORMAL LOW (ref 70–99)
Potassium: 3.8 mmol/L (ref 3.5–5.1)
Sodium: 137 mmol/L (ref 135–145)
Total Bilirubin: 0.7 mg/dL (ref 0.3–1.2)
Total Protein: 6.6 g/dL (ref 6.5–8.1)

## 2019-08-23 LAB — ABO/RH: ABO/RH(D): A POS

## 2019-08-23 MED ORDER — MISOPROSTOL 50MCG HALF TABLET
50.0000 ug | ORAL_TABLET | ORAL | Status: DC | PRN
  Administered 2019-08-23 – 2019-08-24 (×3): 50 ug via BUCCAL
  Filled 2019-08-23 (×3): qty 1

## 2019-08-23 MED ORDER — OXYCODONE-ACETAMINOPHEN 5-325 MG PO TABS: 1.0000 | ORAL_TABLET | ORAL | Status: DC | PRN

## 2019-08-23 MED ORDER — OXYTOCIN BOLUS FROM INFUSION: 500.0000 mL | Freq: Once | INTRAVENOUS | Status: DC

## 2019-08-23 MED ORDER — FENTANYL CITRATE (PF) 100 MCG/2ML IJ SOLN
100.0000 ug | INTRAMUSCULAR | Status: DC | PRN
  Administered 2019-08-24 – 2019-08-25 (×2): 100 ug via INTRAVENOUS
  Filled 2019-08-23 (×2): qty 2

## 2019-08-23 MED ORDER — OXYTOCIN 40 UNITS IN NORMAL SALINE INFUSION - SIMPLE MED
2.5000 [IU]/h | INTRAVENOUS | Status: DC
  Filled 2019-08-23: qty 1000

## 2019-08-23 MED ORDER — ONDANSETRON HCL 4 MG/2ML IJ SOLN: 4.0000 mg | Freq: Four times a day (QID) | INTRAMUSCULAR | Status: DC | PRN

## 2019-08-23 MED ORDER — SOD CITRATE-CITRIC ACID 500-334 MG/5ML PO SOLN
30.0000 mL | ORAL | Status: DC | PRN
  Administered 2019-08-23: 30 mL via ORAL
  Filled 2019-08-23: qty 30

## 2019-08-23 MED ORDER — FLEET ENEMA 7-19 GM/118ML RE ENEM: 1.0000 | ENEMA | RECTAL | Status: DC | PRN

## 2019-08-23 MED ORDER — LACTATED RINGERS IV SOLN
500.0000 mL | INTRAVENOUS | Status: DC | PRN
  Administered 2019-08-25: 250 mL via INTRAVENOUS

## 2019-08-23 MED ORDER — LIDOCAINE HCL (PF) 1 % IJ SOLN
30.0000 mL | INTRAMUSCULAR | Status: AC | PRN
  Administered 2019-08-25: 30 mL via SUBCUTANEOUS
  Filled 2019-08-23: qty 30

## 2019-08-23 MED ORDER — TERBUTALINE SULFATE 1 MG/ML IJ SOLN: 0.2500 mg | Freq: Once | INTRAMUSCULAR | Status: DC | PRN

## 2019-08-23 MED ORDER — LACTATED RINGERS IV SOLN
INTRAVENOUS | Status: DC
  Administered 2019-08-23 – 2019-08-25 (×6): via INTRAVENOUS

## 2019-08-23 MED ORDER — ACETAMINOPHEN 325 MG PO TABS: 650.0000 mg | ORAL_TABLET | ORAL | Status: DC | PRN

## 2019-08-23 MED ORDER — OXYCODONE-ACETAMINOPHEN 5-325 MG PO TABS: 2.0000 | ORAL_TABLET | ORAL | Status: DC | PRN

## 2019-08-23 NOTE — Progress Notes (Signed)
Patient was advised to go directly to the MAU for a full workup for  preeclampsia.   Last B/P reading 155/92 P 89

## 2019-08-23 NOTE — MAU Note (Signed)
Covid swab collected. Pt tolerated well. PT asymptomatic 

## 2019-08-23 NOTE — MAU Note (Addendum)
.   Gabrielle Fernandez is a 23 y.o. at [redacted]w[redacted]d here in MAU reporting: she had a virtual visit today and her blood pressure was up and she only talked to the nurse bcz of her blood pressure reading and was told to come here to be evaluated. Reports headache  Onset of complaint: ongoing  Pain score: 0 Vitals:   08/23/19 1746  BP: (!) 155/96  Pulse: 100  Resp: 18  Temp: 98.3 F (36.8 C)  SpO2: 100%     FHT:125 Lab orders placed from triage: [facility]A

## 2019-08-23 NOTE — H&P (Addendum)
Obstetric History and Physical  Gabrielle Fernandez is a 23 y.o. G1P0 with IUP at [redacted]w[redacted]d presenting for IOL due to gestational hypertension. Had elevated BP at virtual visits last week & today. BP continued to be elevated in MAU today. Also had an elevated DBP in MAU in October. Reports headache since this morning that she rates 7/10 & hasn't treated. Denies visual disturbance or epigastric pain. BPs are not in severe range.   Prenatal Course Source of Care: [place]  with onset of care at 11 weeks Pregnancy complications or risks: Patient Active Problem List   Diagnosis Date Noted  . Supervision of normal pregnancy 01/03/2019   She plans to breastfeed She desires oral contraceptives (estrogen/progesterone) for postpartum contraception.   Prenatal labs and studies: ABO, Rh: A/Positive/-- (05/26 1023) Antibody: Negative (05/26 1023) Rubella: 6.88 (05/26 1023) RPR: Non Reactive (09/08 0849)  HBsAg: Negative (05/26 1023)  HIV: Non Reactive (09/08 0849)  HBZ:JIRCVELF/-- (11/04 1517) 2hr GTT:  normal Genetic screening declined Anatomy US normal  Prenatal Transfer Tool  Maternal Diabetes: No Genetic Screening: Declined Maternal Ultrasounds/Referrals: Normal Fetal Ultrasounds or other Referrals:  None Maternal Substance Abuse:  No Significant Maternal Medications:  None Significant Maternal Lab Results: Group B Strep negative  Past Medical History:  Diagnosis Date  . Medical history non-contributory     Past Surgical History:  Procedure Laterality Date  . APPENDECTOMY    . LAPAROSCOPIC APPENDECTOMY N/A 09/01/2016   Procedure: APPENDECTOMY LAPAROSCOPIC;  Surgeon: Olean Ree, MD;  Location: [facility] ORS;  Service: General;  Laterality: N/A;    OB History  Gravida Para Term Preterm AB Living  1            SAB TAB Ectopic Multiple Live Births               # Outcome Date GA Lbr Len/2nd Weight Sex Delivery Anes PTL Lv  1 Current             Social History    Socioeconomic History  . Marital status: Single    Spouse name: Not on file  . Number of children: Not on file  . Years of education: Not on file  . Highest education level: Not on file  Occupational History  . Not on file  Social Needs  . Financial resource strain: Not on file  . Food insecurity    Worry: Not on file    Inability: Not on file  . Transportation needs    Medical: Not on file    Non-medical: Not on file  Tobacco Use  . Smoking status: Never Smoker  . Smokeless tobacco: Never Used  Substance and Sexual Activity  . Alcohol use: Not Currently  . Drug use: No  . Sexual activity: Yes  Lifestyle  . Physical activity    Days per week: Not on file    Minutes per session: Not on file  . Stress: Not on file  Relationships  . Social Herbalist on phone: Not on file    Gets together: Not on file    Attends religious service: Not on file    Active member of club or organization: Not on file    Attends meetings of clubs or organizations: Not on file    Relationship status: Not on file  Other Topics Concern  . Not on file  Social History Narrative  . Not on file    Family History  Problem Relation Age of Onset  .  Hypertension Mother   . Hypertension Father     Medications Prior to Admission  Medication Sig Dispense Refill Last Dose  . Blood Pressure Monitoring (BLOOD PRESSURE CUFF) MISC 1 Device by Does not apply route once a week. To be monitored weekly at home, please 1 each 0 08/23/2019 at Unknown time  . Elastic Bandages & Supports (COMFORT FIT MATERNITY SUPP SM) MISC 1 Units by Does not apply route daily as needed. 1 each 0 08/23/2019 at Unknown time  . Prenatal Vit-Fe Fumarate-FA (PRENATAL MULTIVITAMIN) TABS tablet Take 1 tablet by mouth daily at 12 noon.   08/23/2019 at Unknown time  . dicyclomine (BENTYL) 10 MG capsule Take 1 capsule (10 mg total) by mouth 3 (three) times daily as needed for up to 14 days for spasms. 16 capsule 0   .  fluticasone (FLONASE) 50 MCG/ACT nasal spray Place 2 sprays into both nostrils daily. (Patient not taking: Reported on 01/03/2019) 16 g 0     No Known Allergies  Review of Systems: Negative except for what is mentioned in HPI.  Physical Exam: BP (!) 152/97   Pulse 95   Temp 98.3 F (36.8 C)   Resp 18   Ht 5\' 7"  (1.702 m)   Wt 98 kg   LMP 10/13/2018 (LMP Unknown)   SpO2 100%   BMI 33.83 kg/m  CONSTITUTIONAL: Well-developed, well-nourished female in no acute distress.  HENT:  Normocephalic, atraumatic, External right and left ear normal. Oropharynx is clear and moist EYES: Conjunctivae and EOM are normal. Pupils are equal, round, and reactive to light. No scleral icterus.  NECK: Normal range of motion, supple, no masses SKIN: Skin is warm and dry. No rash noted. Not diaphoretic. No erythema. No pallor. NEUROLOGIC: Alert and oriented to person, place, and time. Normal reflexes, muscle tone coordination. No cranial nerve deficit noted. PSYCHIATRIC: Normal mood and affect. Normal behavior. Normal judgment and thought content. CARDIOVASCULAR: Normal heart rate noted, regular rhythm RESPIRATORY: Effort and breath sounds normal, no problems with respiration noted ABDOMEN: Soft, nontender, nondistended, gravid. MUSCULOSKELETAL: Normal range of motion. 2+ pitting edema of BLE. 2+ distal pulses.   Presentation: cephalic FHT:  NST:  Baseline: 120 bpm, Variability: Good {> 6 bpm), Accelerations: Reactive and Decelerations: Absent   Pt informed that the ultrasound is considered a limited OB ultrasound and is not intended to be a complete ultrasound exam.  Patient also informed that the ultrasound is not being completed with the intent of assessing for fetal or placental anomalies or any pelvic abnormalities.  Explained that the purpose of today's ultrasound is to assess for  presentation.  Patient acknowledges the purpose of the exam and the limitations of the study.  cephalic   Pertinent  Labs/Studies:   No results found for this or any previous visit (from the past 24 hour(s)).  Assessment : Gabrielle Fernandez is a 23 y.o. G1P0 at [redacted]w[redacted]d being admitted induction of labor due to gestational hypertension. Preeclampsia labs added to admission labs.   Plan: Labor:  Induction as ordered   Analgesia as needed. FWB: Reassuring fetal heart tracing.   GBS negative Delivery plan: Hopeful for vaginal delivery   [redacted]w[redacted]d, NP

## 2019-08-23 NOTE — Progress Notes (Signed)
Reviewed blood pressure readings and current sx which include HA, Edema-specifically facial swelling.   Third BP = 155/92  BP Readings from Last 3 Encounters:  08/23/19 (!) 143/89  08/16/19 (!) 144/88  08/09/19 129/85    Directed patient to MAU given concern for PEC/gHTN. Counseled that she may be induced. Patient agreed to proceed to MAU for evaluation.   Called MAU Provider Noni Saupe NP and gave report.

## 2019-08-23 NOTE — Progress Notes (Signed)
Report given to Meda Klinefelter RN

## 2019-08-24 LAB — RPR: RPR Ser Ql: NONREACTIVE

## 2019-08-24 MED ORDER — MISOPROSTOL 25 MCG QUARTER TABLET
ORAL_TABLET | ORAL | Status: AC
  Filled 2019-08-24: qty 1

## 2019-08-24 MED ORDER — MISOPROSTOL 50MCG HALF TABLET
ORAL_TABLET | ORAL | Status: AC
  Filled 2019-08-24: qty 1

## 2019-08-24 MED ORDER — PROMETHAZINE HCL 25 MG/ML IJ SOLN
12.5000 mg | Freq: Once | INTRAMUSCULAR | Status: AC
  Administered 2019-08-24: 12.5 mg via INTRAVENOUS
  Filled 2019-08-24: qty 1

## 2019-08-24 MED ORDER — MISOPROSTOL 25 MCG QUARTER TABLET
25.0000 ug | ORAL_TABLET | ORAL | Status: DC | PRN
  Administered 2019-08-24 (×2): 25 ug via VAGINAL
  Filled 2019-08-24: qty 1

## 2019-08-24 MED ORDER — BUTORPHANOL TARTRATE 1 MG/ML IJ SOLN
1.0000 mg | Freq: Once | INTRAMUSCULAR | Status: AC
  Administered 2019-08-24: 1 mg via INTRAMUSCULAR
  Filled 2019-08-24: qty 1

## 2019-08-24 MED ORDER — MISOPROSTOL 50MCG HALF TABLET
50.0000 ug | ORAL_TABLET | ORAL | Status: DC | PRN
  Administered 2019-08-24 (×2): 50 ug via BUCCAL
  Filled 2019-08-24 (×2): qty 1

## 2019-08-24 MED ORDER — BUTORPHANOL TARTRATE 1 MG/ML IJ SOLN
1.0000 mg | Freq: Once | INTRAMUSCULAR | Status: AC
  Administered 2019-08-24: 1 mg via INTRAVENOUS
  Filled 2019-08-24: qty 1

## 2019-08-24 NOTE — Progress Notes (Addendum)
Gabrielle Fernandez is a 23 y.o. G1P0 at [redacted]w[redacted]d by ultrasound admitted for IOL due to gHTN.  Subjective: Reports feeling well at this time with no complaints, while feeling some contractions. Denies headache, visual disturbances, nausea, RUQ pain. FOB present and supportive at bedside.  Objective: Vitals:   08/24/19 0801 08/24/19 0820 08/24/19 0831 08/24/19 0910  BP: (!) 145/102 (!) 148/95 (!) 142/99 (!) 144/96  Pulse: 82 77 88 80  Resp:   18 18  Temp:    97.9 F (36.6 C)  TempSrc:    Oral  SpO2:      Weight:      Height:        No intake/output data recorded. No intake/output data recorded.   FHT:  FHR: 130 bpm, variability: moderate,  accelerations:  Present,  decelerations:  Absent UC:   regular, every 2-4 minutes SVE:   Dilation: 1 Effacement (%): 50 Station: -3 Exam by:: [employer] CNM   Labs:   Recent Labs    08/23/19 1845  WBC 8.7  HGB 12.4  HCT 37.4  PLT 185    Assessment / Plan: 23 y.o. G1P0 at [redacted]w[redacted]d by ultrasound admitted for IOL due to gHTN  Labor: Progressing normally on Cytotec - Plan for foley bulb placement on next cervical exam. Preeclampsia: PEC labs WNL on admission 08/23/19 Fetal Wellbeing:  Category I Pain Control:  Labor support without medications I/D:  GBS negative Anticipated MOD:  NSVD  Juanna Cao, SNM, BSN 08/24/2019, 9:38 AM   I confirm that I have verified the information documented in the nurse midwife student's note and that I have also personally reperformed the history, physical exam and all medical decision making activities of this service and have verified that all service and findings are accurately documented in this student's note.   --Patient is s/p Cytotec x 3 buccal, route changed to [place] for Cytotec # 4 this morning  Mallie Snooks, CNM 08/24/2019 10:44 AM

## 2019-08-24 NOTE — Progress Notes (Signed)
Gabrielle Fernandez is a 23 y.o. G1P0 at [redacted]w[redacted]d by ultrasound admitted for IOL due to gHTN.  Subjective: Reports feeling contractions, but denies any pain at this time. Reports feeling well at this time following being up to shower. FOB present and supportive at bedside.  Objective: Vitals:   08/24/19 1101 08/24/19 1201 08/24/19 1329 08/24/19 1330  BP: (!) 134/96 139/90  134/87  Pulse: 90 86  77  Resp:  18    Temp:   98.2 F (36.8 C)   TempSrc:   Oral   SpO2:      Weight:      Height:        No intake/output data recorded. No intake/output data recorded.   FHT:  FHR: 1.5-3 bpm, variability: moderate,  accelerations:  Present,  decelerations:  Absent UC:   regular, every 1.5-3 minutes SVE:   Dilation: 1 Effacement (%): 50 Station: -3 Exam by:: [employer] CNM   Labs:   Recent Labs    08/23/19 1845  WBC 8.7  HGB 12.4  HCT 37.4  PLT 185    Assessment / Plan: 23 y.o. G1P0 at [redacted]w[redacted]d by ultrasound admitted for IOL due to gHTN  Labor: Post Cytotec x4 - Placed Cytotec #5 vaginally. Plan for foley bulb placement at next cervical exam. Preeclampsia: PEC labs WNL on admission 08/23/19 Fetal Wellbeing:  Category I Pain Control:  Labor support without medications I/D:  GBS negative Anticipated MOD:  NSVD  Juanna Cao, SNM, BSN 08/24/2019, 2:21 PM

## 2019-08-24 NOTE — Progress Notes (Signed)
LABOR PROGRESS NOTE  Gabrielle Fernandez is a 23 y.o. G1P0 at [redacted]w[redacted]d  admitted for IOL 2/2 gHTN, negative SF.  Subjective: Patient is not feeling any increased pain or pressure. She has not been ambulating. Resting comfortably.   Objective: BP 137/86   Pulse 83   Temp 98 F (36.7 C) (Oral)   Resp 18   Ht 5\' 7"  (1.702 m)   Wt 98 kg   LMP 10/13/2018 (LMP Unknown)   SpO2 100%   BMI 33.83 kg/m  or  Vitals:   08/24/19 0100 08/24/19 0200 08/24/19 0300 08/24/19 0400  BP: (!) 133/91 127/63 (!) 138/92 137/86  Pulse: 97 (!) 103 80 83  Resp: 18 16 18 18   Temp:      TempSrc:      SpO2:      Weight:      Height:       ~0415 Dilation: 1 Effacement (%): 50 Cervical Position: Posterior Station: -3 Presentation: Vertex Exam by:: Dr. Ouida Sills FHT: baseline rate 120, moderate varibility, 15x15 acel, neg decel Toco: mild  Labs: Lab Results  Component Value Date   WBC 8.7 08/23/2019   HGB 12.4 08/23/2019   HCT 37.4 08/23/2019   MCV 90.1 08/23/2019   PLT 185 08/23/2019    Patient Active Problem List   Diagnosis Date Noted  . Gestational hypertension, third trimester 08/23/2019  . Supervision of normal pregnancy 01/03/2019    Assessment / Plan: 23 y.o. G1P0 at [redacted]w[redacted]d here for  IOL 2/2 gHTN, negative SF. Progressing gradually with cytotec. Recommended increased ambulation if tolerating and repeat buccal cytotec. Unfavorable cervix for FB placement at this time.   Labor: augmented with cytotec. Fetal Wellbeing:  Cat I Pain Control:  none Anticipated MOD:  vaginal  Doristine Mango, DO PGY-2 FM 08/24/2019, 4:21 AM

## 2019-08-24 NOTE — Progress Notes (Signed)
Patient requested to shower. Notified Sam CNM and order to shower given. IV saline locked and wrapped. Monitors removed.

## 2019-08-24 NOTE — Progress Notes (Addendum)
Gabrielle C Rosenaueris a 23 y.o.G1P0 at [redacted]w[redacted]d by ultrasoundadmitted for IOL due to gHTN.  Subjective: Resting comfortably at this time. Denies pain and has no complaints. FOB present and supportive at bedside.  Objective: Vitals:   08/24/19 1330 08/24/19 1517 08/24/19 1609 08/24/19 1719  BP: 134/87 136/87 (!) 139/91 (!) 143/98  Pulse: 77 77 72 80  Resp:  20 18 18   Temp:   98.2 F (36.8 C)   TempSrc:      SpO2:      Weight:      Height:        No intake/output data recorded. No intake/output data recorded.   FHT:  FHR: 125 bpm, variability: moderate,  accelerations:  Present,  decelerations:  Absent UC:   regular, every 2-4 minutes SVE:   Dilation: 1 Effacement (%): 50 Station: -3 Exam by:: Samia Kukla MD   Labs:   Recent Labs    08/23/19 1845  WBC 8.7  HGB 12.4  HCT 37.4  PLT 185    Assessment / Plan: 23 y.o. G1P0 at [redacted]w[redacted]d by ultrasound, here for IOL 2/2 gHTN  Labor:Post Cytotec x5, Cytotec #6 given buccally. Cook's catheter placed during this check. Consider AROM and Pitocin as appropriate. Gestational hypertension:PEC labs WNL on admission 08/23/19. BPs are not in severe range, has not need antihypertensives. Fetal Wellbeing:Category I Pain Control:IV pain medication, epidural as desired I/D:GBS negative Anticipated GTX:MIWOEHOZYY vaginal delivery, CS as appropriate  Juanna Cao, SNM, BSN 08/24/2019, 6:52 PM   GME ATTESTATION:  I saw and evaluated the patient. I agree with the findings and the plan of care as documented in the student's note.  Merilyn Baba, DO OB Fellow, [facility] 08/24/2019 7:28 PM

## 2019-08-24 NOTE — Progress Notes (Signed)
Gabrielle Fernandez is a 23 y.o. G1P0 at [redacted]w[redacted]d   Subjective: Resting comfortably in chair, unaware of contractions. Denies pain, questions, concerns.  Objective: BP 136/87   Pulse 77   Temp 98.2 F (36.8 C) (Oral)   Resp 20   Ht 5\' 7"  (1.702 m)   Wt 98 kg   LMP 10/13/2018 (LMP Unknown)   SpO2 100%   BMI 33.83 kg/m  No intake/output data recorded. No intake/output data recorded.  FHT:  FHR: 120 bpm, variability: moderate,  accelerations:  Present,  decelerations:  Absent UC:   irregular, every 1-5 minutes SVE:   Dilation: 1 Effacement (%): 50 Station: -3 Exam by:: Maryelizabeth Kaufmann CNM   Labs: Lab Results  Component Value Date   WBC 8.7 08/23/2019   HGB 12.4 08/23/2019   HCT 37.4 08/23/2019   MCV 90.1 08/23/2019   PLT 185 08/23/2019   Patient Vitals for the past 24 hrs:  BP Temp Temp src Pulse Resp SpO2 Height Weight  08/24/19 1517 136/87 - - 77 20 - - -  08/24/19 1330 134/87 - - 77 - - - -  08/24/19 1329 - 98.2 F (36.8 C) Oral - - - - -  08/24/19 1201 139/90 - - 86 18 - - -  08/24/19 1101 (!) 134/96 - - 90 - - - -  08/24/19 1019 (!) 140/93 - - 86 - - - -  08/24/19 0910 (!) 144/96 97.9 F (36.6 C) Oral 80 18 - - -  08/24/19 0831 (!) 142/99 - - 88 18 - - -  08/24/19 0820 (!) 148/95 - - 77 - - - -  08/24/19 0801 (!) 145/102 - - 82 - - - -   Assessment / Plan: --23 y.o. G1P0 at [redacted]w[redacted]d  --IOL for gHTN, no severe signs or sx --S/p buccal Cytotec x 3 followed by per vag x 2, most recently at 1415 --At bedside to attempt foley balloon. Patient unable to tolerate digital contact with cervix. Discussed FB placement vs waiting for 1815 when 4 hours from most recent Cytotec. Patient asks to wait --Report given to H. Sparacino, DO who assumes care of patient  Darlina Rumpf, Mallory Shirk 08/24/2019, 4:02 PM

## 2019-08-24 NOTE — Progress Notes (Signed)
Gabrielle C Rosenaueris a 23 y.o.G1P0 at [redacted]w[redacted]d by ultrasoundadmitted for IOL due to gHTN.  Subjective: Sitting in chair, very uncomfortable w foley Would like something for pain  Objective: Vitals:   08/24/19 1517 08/24/19 1609 08/24/19 1719 08/24/19 1851  BP: 136/87 (!) 139/91 (!) 143/98 (!) 147/95  Pulse: 77 72 80 91  Resp: 20 18 18 18   Temp:  98.2 F (36.8 C)  98.2 F (36.8 C)  TempSrc:    Oral  SpO2:      Weight:      Height:        No intake/output data recorded. No intake/output data recorded.   FHT:  Baseline 125, moderate variability, +accels, no decels UC:   irregular, every 2-5 minutes SVE:   Dilation: 1 Effacement (%): 50 Station: -3 Exam by:: Sparacino MD   Labs:   Recent Labs    08/23/19 1845  WBC 8.7  HGB 12.4  HCT 37.4  PLT 185    Assessment / Plan: 23 y.o. G1P0 at [redacted]w[redacted]d by ultrasound, here for IOL 2/2 gHTN  Labor:Post Cytotec x6, Cook's placed at 1900. Gestational hypertension:PEC labs WNL on admission 08/23/19. BPs are not in severe range, has not need antihypertensives. Fetal Wellbeing:Category I Pain Control:IV pain medication, epidural as desired. Discussed stadol for now, patient is amenable. I/D:GBS negative Anticipated HYI:FOYDXAJOIN vaginal delivery, CS as appropriate  Clarnce Flock 08/24/2019, 10:34 PM

## 2019-08-25 ENCOUNTER — Telehealth: Payer: Self-pay | Admitting: Radiology

## 2019-08-25 ENCOUNTER — Encounter (HOSPITAL_COMMUNITY): Payer: Self-pay | Admitting: *Deleted

## 2019-08-25 ENCOUNTER — Inpatient Hospital Stay (HOSPITAL_COMMUNITY): Payer: Medicaid Other | Admitting: Anesthesiology

## 2019-08-25 DIAGNOSIS — Z3A38 38 weeks gestation of pregnancy: Secondary | ICD-10-CM

## 2019-08-25 DIAGNOSIS — O134 Gestational [pregnancy-induced] hypertension without significant proteinuria, complicating childbirth: Secondary | ICD-10-CM

## 2019-08-25 LAB — COMPREHENSIVE METABOLIC PANEL
ALT: 17 U/L (ref 0–44)
AST: 22 U/L (ref 15–41)
Albumin: 2.6 g/dL — ABNORMAL LOW (ref 3.5–5.0)
Alkaline Phosphatase: 101 U/L (ref 38–126)
Anion gap: 14 (ref 5–15)
BUN: 5 mg/dL — ABNORMAL LOW (ref 6–20)
CO2: 20 mmol/L — ABNORMAL LOW (ref 22–32)
Calcium: 9.2 mg/dL (ref 8.9–10.3)
Chloride: 106 mmol/L (ref 98–111)
Creatinine, Ser: 0.58 mg/dL (ref 0.44–1.00)
GFR calc Af Amer: >60 mL/min (ref >60)
GFR calc non Af Amer: >60 mL/min (ref >60)
Glucose, Bld: 84 mg/dL (ref 70–99)
Potassium: 3.6 mmol/L (ref 3.5–5.1)
Sodium: 140 mmol/L (ref 135–145)
Total Bilirubin: 1.2 mg/dL (ref 0.3–1.2)
Total Protein: 6 g/dL — ABNORMAL LOW (ref 6.5–8.1)

## 2019-08-25 LAB — CBC WITH DIFFERENTIAL/PLATELET
Abs Immature Granulocytes: 0.08 10*3/uL — ABNORMAL HIGH (ref 0.00–0.07)
Basophils Absolute: 0 10*3/uL (ref 0.0–0.1)
Basophils Relative: 0 %
Eosinophils Absolute: 0 10*3/uL (ref 0.0–0.5)
Eosinophils Relative: 0 %
HCT: 36 % (ref 36.0–46.0)
Hemoglobin: 12 g/dL (ref 12.0–15.0)
Immature Granulocytes: 1 %
Lymphocytes Relative: 9 %
Lymphs Abs: 1 10*3/uL (ref 0.7–4.0)
MCH: 30 pg (ref 26.0–34.0)
MCHC: 33.3 g/dL (ref 30.0–36.0)
MCV: 90 fL (ref 80.0–100.0)
Monocytes Absolute: 0.9 10*3/uL (ref 0.1–1.0)
Monocytes Relative: 8 %
Neutro Abs: 9.9 10*3/uL — ABNORMAL HIGH (ref 1.7–7.7)
Neutrophils Relative %: 82 %
Platelets: 171 10*3/uL (ref 150–400)
RBC: 4 MIL/uL (ref 3.87–5.11)
RDW: 13.8 % (ref 11.5–15.5)
WBC: 11.9 10*3/uL — ABNORMAL HIGH (ref 4.0–10.5)
nRBC: 0 % (ref 0.0–0.2)

## 2019-08-25 MED ORDER — COCONUT OIL OIL: 1.0000 "application " | TOPICAL_OIL | Status: DC | PRN

## 2019-08-25 MED ORDER — SIMETHICONE 80 MG PO CHEW: 80.0000 mg | CHEWABLE_TABLET | ORAL | Status: DC | PRN

## 2019-08-25 MED ORDER — EPHEDRINE 5 MG/ML INJ: 10.0000 mg | INTRAVENOUS | Status: DC | PRN

## 2019-08-25 MED ORDER — TETANUS-DIPHTH-ACELL PERTUSSIS 5-2.5-18.5 LF-MCG/0.5 IM SUSP: 0.5000 mL | Freq: Once | INTRAMUSCULAR | Status: DC

## 2019-08-25 MED ORDER — DIPHENHYDRAMINE HCL 50 MG/ML IJ SOLN: 12.5000 mg | INTRAMUSCULAR | Status: DC | PRN

## 2019-08-25 MED ORDER — FENTANYL-BUPIVACAINE-NACL 0.5-0.125-0.9 MG/250ML-% EP SOLN
12.0000 mL/h | EPIDURAL | Status: DC | PRN
  Filled 2019-08-25: qty 250

## 2019-08-25 MED ORDER — LACTATED RINGERS IV SOLN
500.0000 mL | Freq: Once | INTRAVENOUS | Status: AC
  Administered 2019-08-25: 500 mL via INTRAVENOUS

## 2019-08-25 MED ORDER — ONDANSETRON HCL 4 MG/2ML IJ SOLN: 4.0000 mg | INTRAMUSCULAR | Status: DC | PRN

## 2019-08-25 MED ORDER — WITCH HAZEL-GLYCERIN EX PADS: 1.0000 "application " | MEDICATED_PAD | CUTANEOUS | Status: DC | PRN

## 2019-08-25 MED ORDER — OXYTOCIN 40 UNITS IN NORMAL SALINE INFUSION - SIMPLE MED
1.0000 m[IU]/min | INTRAVENOUS | Status: DC
  Administered 2019-08-25: 2 m[IU]/min via INTRAVENOUS

## 2019-08-25 MED ORDER — IBUPROFEN 600 MG PO TABS
600.0000 mg | ORAL_TABLET | Freq: Four times a day (QID) | ORAL | Status: DC
  Administered 2019-08-25 – 2019-08-27 (×9): 600 mg via ORAL
  Filled 2019-08-25 (×9): qty 1

## 2019-08-25 MED ORDER — PRENATAL MULTIVITAMIN CH
1.0000 | ORAL_TABLET | Freq: Every day | ORAL | Status: DC
  Administered 2019-08-26 – 2019-08-27 (×2): 1 via ORAL
  Filled 2019-08-25 (×3): qty 1

## 2019-08-25 MED ORDER — ACETAMINOPHEN 325 MG PO TABS: 650.0000 mg | ORAL_TABLET | ORAL | Status: DC | PRN

## 2019-08-25 MED ORDER — DIBUCAINE (PERIANAL) 1 % EX OINT: 1.0000 "application " | TOPICAL_OINTMENT | CUTANEOUS | Status: DC | PRN

## 2019-08-25 MED ORDER — ONDANSETRON HCL 4 MG PO TABS: 4.0000 mg | ORAL_TABLET | ORAL | Status: DC | PRN

## 2019-08-25 MED ORDER — LIDOCAINE HCL (PF) 1 % IJ SOLN
INTRAMUSCULAR | Status: DC | PRN
  Administered 2019-08-25 (×2): 5 mL via EPIDURAL

## 2019-08-25 MED ORDER — LACTATED RINGERS IV SOLN: 500.0000 mL | Freq: Once | INTRAVENOUS | Status: DC

## 2019-08-25 MED ORDER — SENNOSIDES-DOCUSATE SODIUM 8.6-50 MG PO TABS
2.0000 | ORAL_TABLET | ORAL | Status: DC
  Administered 2019-08-25 – 2019-08-26 (×2): 2 via ORAL
  Filled 2019-08-25 (×2): qty 2

## 2019-08-25 MED ORDER — SODIUM CHLORIDE (PF) 0.9 % IJ SOLN
INTRAMUSCULAR | Status: DC | PRN
  Administered 2019-08-25: 12 mL/h via EPIDURAL

## 2019-08-25 MED ORDER — ZOLPIDEM TARTRATE 5 MG PO TABS: 5.0000 mg | ORAL_TABLET | Freq: Every evening | ORAL | Status: DC | PRN

## 2019-08-25 MED ORDER — BENZOCAINE-MENTHOL 20-0.5 % EX AERO
1.0000 "application " | INHALATION_SPRAY | CUTANEOUS | Status: DC | PRN
  Administered 2019-08-25: 1 via TOPICAL
  Filled 2019-08-25: qty 56

## 2019-08-25 MED ORDER — PHENYLEPHRINE 40 MCG/ML (10ML) SYRINGE FOR IV PUSH (FOR BLOOD PRESSURE SUPPORT): 80.0000 ug | PREFILLED_SYRINGE | INTRAVENOUS | Status: DC | PRN

## 2019-08-25 MED ORDER — DIPHENHYDRAMINE HCL 25 MG PO CAPS: 25.0000 mg | ORAL_CAPSULE | Freq: Four times a day (QID) | ORAL | Status: DC | PRN

## 2019-08-25 NOTE — Anesthesia Procedure Notes (Signed)
Epidural Patient location during procedure: OB Start time: 08/25/2019 4:45 AM End time: 08/25/2019 4:52 AM  Staffing Anesthesiologist: Albertha Ghee, MD Performed: anesthesiologist   Preanesthetic Checklist Completed: patient identified, site marked, pre-op evaluation, timeout performed, IV checked, risks and benefits discussed and monitors and equipment checked  Epidural Patient position: sitting Prep: DuraPrep Patient monitoring: heart rate, cardiac monitor, continuous pulse ox and blood pressure Approach: midline Location: L2-L3 Injection technique: LOR saline  Needle:  Needle type: Tuohy  Needle gauge: 17 G Needle length: 9 cm Needle insertion depth: 5 cm Catheter type: closed end flexible Catheter size: 19 Gauge Catheter at skin depth: 12 cm Test dose: negative and Other  Assessment Events: blood not aspirated, injection not painful, no injection resistance and negative IV test  Additional Notes Informed consent obtained prior to proceeding including risk of failure, 1% risk of PDPH, risk of minor discomfort and bruising.  Discussed rare but serious complications including epidural abscess, permanent nerve injury, epidural hematoma.  Discussed alternatives to epidural analgesia and patient desires to proceed.  Timeout performed pre-procedure verifying patient name, procedure, and platelet count.  Patient tolerated procedure well. Reason for block:procedure for pain

## 2019-08-25 NOTE — Lactation Note (Signed)
This note was copied from a baby's chart. Lactation Consultation Note  Patient Name: Gabrielle Fernandez SFSEL'T Date: 08/25/2019 Reason for consult: Initial assessment;Primapara;1st time breastfeeding;Early term 37-38.6wks  RN Nira Conn reported to [facility] that mom has completely switched to formula. She has helped mom BF and even got her shells and a pump but she has changed her mind. Lactation services no longer needed.  Maternal Data    Feeding Feeding Type: Formula Nipple Type: Slow - flow  LATCH Score                   Interventions    Lactation Tools Discussed/Used     Consult Status Consult Status: Complete    Shasha Buchbinder S Deneise Getty 08/25/2019, 3:55 PM

## 2019-08-25 NOTE — Progress Notes (Signed)
LABOR PROGRESS NOTE  Gabrielle Fernandez is a 23 y.o. G1P0 at [redacted]w[redacted]d  admitted for IOL 2/2 gHTN  Subjective: Patient feeling pelvic pressure. Concern for FHR monitor.  Objective: BP 136/88   Pulse 98   Temp 98.5 F (36.9 C) (Oral)   Resp 18   Ht 5\' 7"  (1.702 m)   Wt 98 kg   LMP 10/13/2018 (LMP Unknown)   SpO2 97%   BMI 33.83 kg/m  or  Vitals:   08/25/19 0630 08/25/19 0700 08/25/19 0704 08/25/19 0705  BP: 129/82 136/88    Pulse: 95 98    Resp: 18     Temp:      TempSrc:      SpO2:  98% 97% 97%  Weight:      Height:       0700 Dilation: 4 Effacement (%): 80, 90 Cervical Position: Middle Station: 0 Presentation: Vertex Exam by:: Dr. Ouida Sills FHT: baseline rate highly variable 100-130, high varibility, positive acel, several variable and few short late decel Toco: mild-moderate  Labs: Lab Results  Component Value Date   WBC 11.9 (H) 08/25/2019   HGB 12.0 08/25/2019   HCT 36.0 08/25/2019   MCV 90.0 08/25/2019   PLT 171 08/25/2019    Patient Active Problem List   Diagnosis Date Noted  . Gestational hypertension, third trimester 08/23/2019  . Supervision of normal pregnancy 01/03/2019    Assessment / Plan: 23 y.o. G1P0 at [redacted]w[redacted]d here for IOL 2/2 gHTN  Labor: cervix progressing. Concerning FHR strip, Cat II with some variables and few lates. Inserted IUPC. Nurse repositioning. Not increasing pit until improved. Keep pulse ox on mom. Fetal Wellbeing:  Cat II Pain Control:  epidural Anticipated MOD:  vaginal  Doristine Mango, DO PGY-2 FM 08/25/2019, 7:11 AM

## 2019-08-25 NOTE — Telephone Encounter (Signed)
Left message with new appointment information, BP check and Postpartum

## 2019-08-25 NOTE — Discharge Summary (Signed)
Postpartum Discharge Summary     Patient Name: Gabrielle Fernandez DOB: 1996-01-12 MRN: [id number]  Date of admission: 08/23/2019 Delivering Provider: Merilyn Baba   Date of discharge: 08/27/2019  Admitting diagnosis: HBP Intrauterine pregnancy: [redacted]w[redacted]d    Secondary diagnosis:  Active Problems:   Gestational hypertension, third trimester   SVD (spontaneous vaginal delivery)  Additional problems: None     Discharge diagnosis: Term Pregnancy Delivered and Gestational Hypertension                                                                                                Post partum procedures:none  Augmentation: Pitocin, Cytotec and Foley Balloon  Complications: None  Hospital course:  Induction of Labor With Vaginal Delivery   23y.o. yo G1P0 at 38w5das admitted to the hospital 08/23/2019 for induction of labor.  Indication for induction: Gestational hypertension.  Patient had an uncomplicated labor course as follows:  She was admitted to the labor floor and received cytotec. A cook's catheter was placed. She SROMed and then progressed on pitocin.  Membrane Rupture Time/Date: 12:22 AM ,08/25/2019   Intrapartum Procedures: Episiotomy: None [1]                                         Lacerations:  2nd degree [3];Perineal [11]  Patient had delivery of a Viable infant.  Information for the patient's newborn:  RoDavelyn, Gwinnirl [id number]]Delivery Method: Vag-Spont    08/25/2019  Details of delivery can be found in separate delivery note.  Patient had a routine postpartum course. Patient is discharged home 08/27/19. Delivery time: 8:36 AM    Magnesium Sulfate received: No BMZ received: No Rhophylac:N/A MMR:N/A Transfusion:No  Physical exam  Vitals:   08/26/19 0500 08/26/19 1532 08/26/19 2223 08/27/19 0528  BP: (!) 122/95 135/90 (!) 136/96 131/79  Pulse: 65 82 73 75  Resp: _0 Temp: 97.6 F (36.4 C) 99 F (37.2 C) 99.1 F (37.3 C) 98.4  F (36.9 C)  TempSrc: Oral Oral Oral Oral  SpO2:  100%    Weight:      Height:       General: alert, cooperative and no distress Lochia: appropriate Uterine Fundus: firm Incision: N/A DVT Evaluation: No evidence of DVT seen on physical exam. No cords or calf tenderness. Calf/Ankle edema is present Labs: Lab Results  Component Value Date   WBC 10.1 08/26/2019   HGB 10.0 (L) 08/26/2019   HCT 30.6 (L) 08/26/2019   MCV 90.5 08/26/2019   PLT 143 (L) 08/26/2019   CMP Latest Ref Rng & Units 08/25/2019  Glucose 70 - 99 mg/dL 84  BUN 6 - 20 mg/dL 5(L)  Creatinine 0.44 - 1.00 mg/dL 0.58  Sodium 135 - 145 mmol/L 140  Potassium 3.5 - 5.1 mmol/L 3.6  Chloride 98 - 111 mmol/L 106  CO2 22 - 32 mmol/L 20(L)  Calcium 8.9 - 10.3 mg/dL 9.2  Total Protein 6.5 - 8.1 g/dL 6.0(L)  Total Bilirubin 0.3 - 1.2 mg/dL 1.2  Alkaline Phos 38 - 126 U/L 101  AST 15 - 41 U/L 22  ALT 0 - 44 U/L 17    Discharge instruction: per After Visit Summary and "Baby and Me Booklet".  After visit meds:  Allergies as of 08/27/2019   No Known Allergies     Medication List    TAKE these medications   Blood Pressure Cuff Misc 1 Device by Does not apply route once a week. To be monitored weekly at home, please   [place] 1 Units by Does not apply route daily as needed.   hydrochlorothiazide 12.5 MG capsule Commonly known as: Microzide Take 1 capsule (12.5 mg total) by mouth daily for 3 days.   ibuprofen 600 MG tablet Commonly known as: ADVIL Take 1 tablet (600 mg total) by mouth every 6 (six) hours.   lisinopril 10 MG tablet Commonly known as: ZESTRIL Take 1 tablet (10 mg total) by mouth daily. Start taking on: August 28, 2019   prenatal multivitamin Tabs tablet Take 1 tablet by mouth daily at 12 noon.       Diet: routine diet  Activity: Advance as tolerated. Pelvic rest for 6 weeks.   Outpatient follow up:1 week for BP check and 4 weeks for PP  appointment Follow up Appt: Future Appointments  Date Time Provider [place]  08/30/2019 10:00 AM [facility] NURSE [facility]  09/20/2019  2:45 PM Caren Macadam, MD [facility]   Follow up Visit: [place] [facility] [place] [facility]. Schedule an appointment as soon as possible for a visit in 4 week(s).   Specialty: Obstetrics and Gynecology Why: Make appointment to be seen in 1 week for blood pressure check and 4 weeks for postpartum appointment  Contact information: [address] [place] [phone]         Please schedule this patient for Postpartum visit in: 4 weeks with the following provider: Any provider For C/S patients schedule nurse incision check in weeks 2 weeks: no Low risk pregnancy complicated by: gestational HTN Delivery mode:  vaginal delivery Anticipated Birth Control:  OCPs PP Procedures needed: BP check  Schedule Integrated BH visit: no  Newborn Data: Live born female  Birth Weight:  3055g (6lb 11.8oz) APGAR: 9,9   Newborn Delivery   Birth date/time: 08/25/2019 08:36:00 Delivery type: Vaginal, Spontaneous      Baby Feeding: Bottle Disposition:home with mother   08/27/2019 Lajean Manes, CNM

## 2019-08-25 NOTE — Anesthesia Preprocedure Evaluation (Signed)
Anesthesia Evaluation  Patient identified by MRN, date of birth, ID band Patient awake    Reviewed: Allergy & Precautions, H&P , NPO status , Patient's Chart, lab work & pertinent test results  Airway Mallampati: II   Neck ROM: full    Dental   Pulmonary neg pulmonary ROS,    breath sounds clear to auscultation       Cardiovascular hypertension,  Rhythm:regular Rate:Normal     Neuro/Psych    GI/Hepatic   Endo/Other    Renal/GU      Musculoskeletal   Abdominal   Peds  Hematology   Anesthesia Other Findings   Reproductive/Obstetrics (+) Pregnancy                             Anesthesia Physical Anesthesia Plan  ASA: I  Anesthesia Plan: Epidural   Post-op Pain Management:    Induction: Intravenous  PONV Risk Score and Plan: 2 and Treatment may vary due to age or medical condition  Airway Management Planned: Natural Airway  Additional Equipment:   Intra-op Plan:   Post-operative Plan:   Informed Consent: I have reviewed the patients History and Physical, chart, labs and discussed the procedure including the risks, benefits and alternatives for the proposed anesthesia with the patient or authorized representative who has indicated his/her understanding and acceptance.       Plan Discussed with: Anesthesiologist  Anesthesia Plan Comments:         Anesthesia Quick Evaluation  

## 2019-08-25 NOTE — Progress Notes (Addendum)
LABOR PROGRESS NOTE  Gabrielle Fernandez is a 23 y.o. G1P0 at [redacted]w[redacted]d  admitted for IOL for gHTN.  Subjective: SROM 0015  Objective: BP (!) 147/98   Pulse 89   Temp 98.5 F (36.9 C) (Oral)   Resp 18   Ht 5\' 7"  (1.702 m)   Wt 98 kg   LMP 10/13/2018 (LMP Unknown)   SpO2 100%   BMI 33.83 kg/m  or  Vitals:   08/25/19 0001 08/25/19 0100 08/25/19 0136 08/25/19 0200  BP: 129/75 132/77 (!) 150/107 (!) 147/98  Pulse: 89 86 (!) 102 89  Resp: 16 16 20 18   Temp:      TempSrc:      SpO2:      Weight:      Height:       1830 Dilation: 1 Effacement (%): 50 Cervical Position: Posterior Station: -3 Presentation: Vertex Exam by:: Sparacino MD  FHT: baseline rate 130, moderate varibility, 15x15 acel, neg decel Toco: mild  Labs: Lab Results  Component Value Date   WBC 8.7 08/23/2019   HGB 12.4 08/23/2019   HCT 37.4 08/23/2019   MCV 90.1 08/23/2019   PLT 185 08/23/2019    Patient Active Problem List   Diagnosis Date Noted  . Gestational hypertension, third trimester 08/23/2019  . Supervision of normal pregnancy 01/03/2019    Assessment / Plan: 23 y.o. G1P0 at [redacted]w[redacted]d here for IOL for gHTN. Bps have been in 150s/110.  Labor: cook still in place. Last cytotec given at 2300 (6th). Repeat PEC labs Fetal Wellbeing:  Cat I Pain Control:  fentanyl + phenergan  Anticipated MOD:  vaginal  Doristine Mango, DO PGY-2 FM  08/25/2019, 3:21 AM

## 2019-08-26 LAB — CBC
HCT: 30.6 % — ABNORMAL LOW (ref 36.0–46.0)
Hemoglobin: 10 g/dL — ABNORMAL LOW (ref 12.0–15.0)
MCH: 29.6 pg (ref 26.0–34.0)
MCHC: 32.7 g/dL (ref 30.0–36.0)
MCV: 90.5 fL (ref 80.0–100.0)
Platelets: 143 10*3/uL — ABNORMAL LOW (ref 150–400)
RBC: 3.38 MIL/uL — ABNORMAL LOW (ref 3.87–5.11)
RDW: 14 % (ref 11.5–15.5)
WBC: 10.1 10*3/uL (ref 4.0–10.5)
nRBC: 0 % (ref 0.0–0.2)

## 2019-08-26 LAB — BIRTH TISSUE RECOVERY COLLECTION (PLACENTA DONATION)

## 2019-08-26 MED ORDER — LISINOPRIL 10 MG PO TABS
10.0000 mg | ORAL_TABLET | Freq: Every day | ORAL | Status: DC
  Administered 2019-08-26 – 2019-08-27 (×2): 10 mg via ORAL
  Filled 2019-08-26 (×2): qty 1

## 2019-08-26 NOTE — Anesthesia Postprocedure Evaluation (Signed)
Anesthesia Post Note  Patient: Gabrielle Fernandez  Procedure(s) Performed: AN AD HOC LABOR EPIDURAL     Patient location during evaluation: Mother Baby Anesthesia Type: Epidural Level of consciousness: awake and alert Pain management: pain level controlled Vital Signs Assessment: post-procedure vital signs reviewed and stable Respiratory status: spontaneous breathing, nonlabored ventilation and respiratory function stable Cardiovascular status: stable Postop Assessment: no headache, no backache and epidural receding Anesthetic complications: no    Last Vitals:  Vitals:   08/26/19 0150 08/26/19 0500  BP: 135/86 (!) 122/95  Pulse: 79 65  Resp: 16 18  Temp: 36.9 C 36.4 C  SpO2:      Last Pain:  Vitals:   08/26/19 0518  TempSrc:   PainSc: 0-No pain   Pain Goal:                   Rayvon Char

## 2019-08-26 NOTE — Progress Notes (Signed)
Post Partum Day 1  Subjective: no complaints, up ad lib, voiding, tolerating PO, + flatus and pt also reporting increased swelling in legs bilaterally, but states that it is just slightly more than when she was pregnant. Pt also states desire to go home today, but is unable to as baby is jaundice and patient is first-time mother. Will D/C home tomorrow.  Objective: Blood pressure (!) 122/95, pulse 65, temperature 97.6 F (36.4 C), temperature source Oral, resp. rate 18, height 5\' 7"  (1.702 m), weight 98 kg, last menstrual period 10/13/2018, SpO2 97 %, unknown if currently breastfeeding.  Patient Vitals for the past 24 hrs:  BP Temp Temp src Pulse Resp  08/26/19 0500 (!) 122/95 97.6 F (36.4 C) Oral 65 18  08/26/19 0150 135/86 98.5 F (36.9 C) Oral 79 16  08/25/19 2105 (!) 132/97 98.2 F (36.8 C) Oral 73 17  08/25/19 1715 117/74 - - 18 18   Consulted with Dr. Kennon Rounds @1218PM  re: BP. Per Dr. Kennon Rounds, start on lisinopril 10mg  daily. Per Dr. Kennon Rounds, [place] to use with breastfeeding patient. Will D/C home on BP meds.  Physical Exam:  General: alert, cooperative and no distress Lochia: appropriate Uterine Fundus: firm Incision: N/A DVT Evaluation: No evidence of DVT seen on physical exam. No cords or calf tenderness. Calf/Ankle edema is present.  Recent Labs    08/25/19 0345 08/26/19 0522  HGB 12.0 10.0*  HCT 36.0 30.6*    Assessment/Plan: Plan for discharge tomorrow and Contraception OCPs at postpartum visit.  D/C home on BP meds.   LOS: 3 days   Gerrie Nordmann Nugent 08/26/2019, 12:25 PM

## 2019-08-27 MED ORDER — IBUPROFEN 600 MG PO TABS: 600.0000 mg | ORAL_TABLET | Freq: Four times a day (QID) | ORAL | 0 refills | Status: DC

## 2019-08-27 MED ORDER — LISINOPRIL 10 MG PO TABS: 10.0000 mg | ORAL_TABLET | Freq: Every day | ORAL | 0 refills | Status: DC

## 2019-08-27 MED ORDER — HYDROCHLOROTHIAZIDE 12.5 MG PO CAPS: 12.5000 mg | ORAL_CAPSULE | Freq: Every day | ORAL | 0 refills | Status: DC

## 2019-08-30 ENCOUNTER — Encounter: Payer: Medicaid Other | Admitting: Advanced Practice Midwife

## 2019-08-30 ENCOUNTER — Other Ambulatory Visit: Payer: Self-pay

## 2019-08-30 ENCOUNTER — Ambulatory Visit (INDEPENDENT_AMBULATORY_CARE_PROVIDER_SITE_OTHER): Payer: Medicaid Other | Admitting: *Deleted

## 2019-08-30 VITALS — BP 119/83 | HR 102

## 2019-08-30 DIAGNOSIS — O133 Gestational [pregnancy-induced] hypertension without significant proteinuria, third trimester: Secondary | ICD-10-CM

## 2019-08-30 NOTE — Progress Notes (Signed)
Subjective:  Gabrielle Fernandez is a 23 y.o. female here for BP check.   Hypertension ROS: taking medications as instructed, no medication side effects noted, no TIA's, no chest pain on exertion, no dyspnea on exertion and no swelling of ankles.    Objective:  LMP 10/13/2018 (LMP Unknown)   Appearance alert, well appearing, and in no distress. General exam BP noted to be well controlled today in office.    Assessment:   Blood Pressure well controlled.   Plan:  Current treatment plan is effective, no change in therapy.. Follow up at postpartum visit.

## 2019-08-30 NOTE — Progress Notes (Signed)
ATTESTATION OF SUPERVISION OF RN: Evaluation and management procedures were performed by the RN under my supervision and collaboration. I have reviewed the nursing note and chart and agree with the management and plan for this patient.  Darik Massing, CNM  

## 2019-09-19 ENCOUNTER — Other Ambulatory Visit: Payer: Self-pay | Admitting: *Deleted

## 2019-09-19 MED ORDER — LISINOPRIL 10 MG PO TABS: 10.0000 mg | ORAL_TABLET | Freq: Every day | ORAL | 0 refills | Status: DC

## 2019-09-20 ENCOUNTER — Ambulatory Visit: Payer: Medicaid Other | Admitting: Family Medicine

## 2020-11-22 ENCOUNTER — Other Ambulatory Visit: Payer: Self-pay

## 2020-11-22 ENCOUNTER — Ambulatory Visit
Admission: EM | Admit: 2020-11-22 | Discharge: 2020-11-22 | Disposition: A | Discharge status: Home / Self Care | Payer: Medicaid Other | Attending: Internal Medicine | Admitting: Internal Medicine

## 2020-11-22 ENCOUNTER — Encounter: Payer: Self-pay | Admitting: Emergency Medicine

## 2020-11-22 DIAGNOSIS — N3001 Acute cystitis with hematuria: Secondary | ICD-10-CM | POA: Diagnosis not present

## 2020-11-22 HISTORY — DX: Acne, unspecified: L70.9

## 2020-11-22 LAB — URINALYSIS, COMPLETE (UACMP) WITH MICROSCOPIC
Bilirubin Urine: NEGATIVE
Glucose, UA: 100 mg/dL — AB
Ketones, ur: NEGATIVE mg/dL
Leukocytes,Ua: NEGATIVE
Nitrite: POSITIVE — AB
Protein, ur: 100 mg/dL — AB
Specific Gravity, Urine: 1.025 (ref 1.005–1.030)
pH: 7.5 (ref 5.0–8.0)

## 2020-11-22 LAB — WET PREP, GENITAL
Clue Cells Wet Prep HPF POC: NONE SEEN
Sperm: NONE SEEN
Trich, Wet Prep: NONE SEEN
WBC, Wet Prep HPF POC: NONE SEEN
Yeast Wet Prep HPF POC: NONE SEEN

## 2020-11-22 MED ORDER — IBUPROFEN 600 MG PO TABS: 600.0000 mg | ORAL_TABLET | Freq: Four times a day (QID) | ORAL | 0 refills | Status: DC | PRN

## 2020-11-22 MED ORDER — CEPHALEXIN 500 MG PO CAPS: 500.0000 mg | ORAL_CAPSULE | Freq: Two times a day (BID) | ORAL | 0 refills | Status: AC

## 2020-11-22 MED ORDER — KETOROLAC TROMETHAMINE 60 MG/2ML IM SOLN
30.0000 mg | Freq: Once | INTRAMUSCULAR | Status: AC
  Administered 2020-11-22: 30 mg via INTRAMUSCULAR

## 2020-11-22 NOTE — Discharge Instructions (Addendum)
Increase oral fluid intake The significant amount of blood in your urine may be as a result of kidney stone or as a result of your menses which just ended. Take medications as directed If you have worsening symptoms please return to urgent care to be reevaluated We will call you if your cultures require antibiotics to be changed

## 2020-11-22 NOTE — ED Provider Notes (Signed)
[facility]    CSN: [id number] Arrival date & time: 11/22/20  1037      History   Chief Complaint Chief Complaint  Patient presents with  . Urinary Frequency  . Flank Pain  . Abdominal Pain    LLQ    HPI Gabrielle Fernandez is a 25 y.o. female comes to urgent care with 1 day history of lower abdominal pain, urinary frequency and flank pain.  Patient says onset was sudden and has been persistent.  No fever or chills.  She has had some dysuria.  No nausea vomiting.  Patient's menses ended 3 days ago.  Patient has a history of kidney stones.  Pain is dull, currently 8/10, throbbing, aggravated by palpation and denies any known relieving factors.   HPI  Past Medical History:  Diagnosis Date  . Acne   . Medical history non-contributory     Patient Active Problem List   Diagnosis Date Noted  . Gestational hypertension, third trimester 08/23/2019  . Supervision of normal pregnancy 01/03/2019    Past Surgical History:  Procedure Laterality Date  . APPENDECTOMY    . LAPAROSCOPIC APPENDECTOMY N/A 09/01/2016   Procedure: APPENDECTOMY LAPAROSCOPIC;  Surgeon: Henrene Dodge, MD;  Location: [facility] ORS;  Service: General;  Laterality: N/A;    OB History    Gravida  1   Para  1   Term  1   Preterm      AB      Living  1     SAB      IAB      Ectopic      Multiple  0   Live Births  1            Home Medications    Prior to Admission medications   Medication Sig Start Date End Date Taking? Authorizing Provider  cephALEXin (KEFLEX) 500 MG capsule Take 1 capsule (500 mg total) by mouth 2 (two) times daily for 5 days. 11/22/20 11/27/20 Yes Makyna Niehoff, Britta Mccreedy, MD  ibuprofen (ADVIL) 600 MG tablet Take 1 tablet (600 mg total) by mouth every 6 (six) hours as needed. 11/22/20  Yes Aritha Huckeba, Britta Mccreedy, MD  spironolactone (ALDACTONE) 25 MG tablet Take 25 mg by mouth daily. 08/23/20  Yes [provider]  Blood Pressure Monitoring (BLOOD PRESSURE CUFF) MISC  1 Device by Does not apply route once a week. To be monitored weekly at home, please 02/28/19 11/22/20  Reva Bores, MD  hydrochlorothiazide (MICROZIDE) 12.5 MG capsule Take 1 capsule (12.5 mg total) by mouth daily for 3 days. 08/27/19 11/22/20  Sharyon Cable, CNM    Family History Family History  Problem Relation Age of Onset  . Hypertension Mother   . Hypertension Father     Social History Social History   Tobacco Use  . Smoking status: Never Smoker  . Smokeless tobacco: Never Used  Vaping Use  . Vaping Use: Never used  Substance Use Topics  . Alcohol use: Not Currently  . Drug use: No     Allergies   Patient has no known allergies.   Review of Systems Review of Systems  Constitutional: Negative.   HENT: Negative.   Gastrointestinal: Positive for abdominal pain. Negative for nausea and vomiting.  Genitourinary: Positive for dysuria, flank pain, frequency and urgency. Negative for pelvic pain and vaginal discharge.  Musculoskeletal: Negative for arthralgias and myalgias.     Physical Exam Triage Vital Signs ED Triage Vitals  Enc Vitals Group  BP 11/22/20 1103 130/85     Pulse Rate 11/22/20 1103 66     Resp 11/22/20 1103 18     Temp 11/22/20 1103 98 F (36.7 C)     Temp Source 11/22/20 1103 Oral     SpO2 11/22/20 1103 100 %     Weight 11/22/20 1104 175 lb (79.4 kg)     Height 11/22/20 1104 5\' 7"  (1.702 m)     Head Circumference --      Peak Flow --      Pain Score 11/22/20 1103 9     Pain Loc --      Pain Edu? --      Excl. in GC? --    No data found.  Updated Vital Signs BP 130/85 (BP Location: Left Arm)   Pulse 66   Temp 98 F (36.7 C) (Oral)   Resp 18   Ht 5\' 7"  (1.702 m)   Wt 79.4 kg   LMP 11/15/2020 (Approximate)   SpO2 100%   BMI 27.41 kg/m   Visual Acuity Right Eye Distance:   Left Eye Distance:   Bilateral Distance:    Right Eye Near:   Left Eye Near:    Bilateral Near:     Physical Exam Vitals and nursing note  reviewed.  Constitutional:      General: She is in acute distress.  Abdominal:     General: Bowel sounds are normal.     Palpations: Abdomen is soft.     Tenderness: There is abdominal tenderness in the suprapubic area. There is no right CVA tenderness, left CVA tenderness, guarding or rebound.     Hernia: No hernia is present.      UC Treatments / Results  Labs (all labs ordered are listed, but only abnormal results are displayed) Labs Reviewed  URINALYSIS, COMPLETE (UACMP) WITH MICROSCOPIC - Abnormal; Notable for the following components:      Result Value   Color, Urine ORANGE (*)    APPearance HAZY (*)    Glucose, UA 100 (*)    Hgb urine dipstick LARGE (*)    Protein, ur 100 (*)    Nitrite POSITIVE (*)    Bacteria, UA MANY (*)    All other components within normal limits  WET PREP, GENITAL  URINE CULTURE    EKG   Radiology No results found.  Procedures Procedures (including critical care time)  Medications Ordered in UC Medications  ketorolac (TORADOL) injection 30 mg (has no administration in time range)    Initial Impression / Assessment and Plan / UC Course  I have reviewed the triage vital signs and the nursing notes.  Pertinent labs & imaging results that were available during my care of the patient were reviewed by me and considered in my medical decision making (see chart for details).     1.  Acute cystitis with hematuria: Increase oral fluid intake Keflex 500 mg twice daily Point-of-care urinalysis is significant for bacteria, nitrites, RBCs. Urine cultures have been sent Toradol 30 mg IM x1 dose We will call patient with results if abnormal or if plan of care needs to be modified. Final Clinical Impressions(s) / UC Diagnoses   Final diagnoses:  Acute cystitis with hematuria     Discharge Instructions     Increase oral fluid intake The significant amount of blood in your urine may be as a result of kidney stone or as a result of your  menses which just ended. Take medications as directed  If you have worsening symptoms please return to urgent care to be reevaluated We will call you if your cultures require antibiotics to be changed   ED Prescriptions    Medication Sig Dispense Auth. Provider   cephALEXin (KEFLEX) 500 MG capsule Take 1 capsule (500 mg total) by mouth 2 (two) times daily for 5 days. 10 capsule Joel Cowin, Britta Mccreedy, MD   ibuprofen (ADVIL) 600 MG tablet Take 1 tablet (600 mg total) by mouth every 6 (six) hours as needed. 30 tablet Avry Monteleone, Britta Mccreedy, MD     PDMP not reviewed this encounter.   Merrilee Jansky, MD 11/22/20 1149

## 2020-11-22 NOTE — ED Triage Notes (Signed)
Patient in today c/o urinary frequency, flank pain x 1 day and LLQ abdominal pain today. Patient denies any discharge. Patient has taken OTC AZO.

## 2020-11-23 LAB — URINE CULTURE: Culture: <10000 — AB

## 2021-08-11 ENCOUNTER — Other Ambulatory Visit: Payer: Self-pay

## 2021-08-11 ENCOUNTER — Ambulatory Visit (INDEPENDENT_AMBULATORY_CARE_PROVIDER_SITE_OTHER): Payer: Medicaid Other | Admitting: Obstetrics & Gynecology

## 2021-08-11 ENCOUNTER — Ambulatory Visit (INDEPENDENT_AMBULATORY_CARE_PROVIDER_SITE_OTHER): Payer: Medicaid Other

## 2021-08-11 VITALS — BP 118/77 | HR 67 | Wt 179.0 lb

## 2021-08-11 DIAGNOSIS — O3680X0 Pregnancy with inconclusive fetal viability, not applicable or unspecified: Secondary | ICD-10-CM

## 2021-08-11 DIAGNOSIS — Z3481 Encounter for supervision of other normal pregnancy, first trimester: Secondary | ICD-10-CM

## 2021-08-11 DIAGNOSIS — Z3480 Encounter for supervision of other normal pregnancy, unspecified trimester: Secondary | ICD-10-CM | POA: Insufficient documentation

## 2021-08-11 DIAGNOSIS — Z3A01 Less than 8 weeks gestation of pregnancy: Secondary | ICD-10-CM

## 2021-08-11 MED ORDER — DOXYLAMINE-PYRIDOXINE 10-10 MG PO TBEC: 2.0000 | DELAYED_RELEASE_TABLET | Freq: Every day | ORAL | 5 refills | Status: DC

## 2021-08-11 MED ORDER — PROMETHAZINE HCL 25 MG PO TABS: 25.0000 mg | ORAL_TABLET | Freq: Four times a day (QID) | ORAL | 2 refills | Status: DC | PRN

## 2021-08-11 NOTE — Progress Notes (Signed)
Patient was assessed and managed by nursing staff during this encounter. I have reviewed the chart and agree with the documentation and plan.   Gabbrielle Mcnicholas, MD 08/11/2021 4:49 PM 

## 2021-08-11 NOTE — Progress Notes (Signed)
New OB Intake  I explained I am completing New OB Intake today. We discussed her EDD of 03/26/22 that is based on LMP of 06/19/21. Pt is G2/P1. I reviewed her allergies, medications, Medical/Surgical/OB history, and appropriate screenings.    Patient Active Problem List   Diagnosis Date Noted   Supervision of other normal pregnancy, antepartum 08/11/2021   Gestational hypertension, third trimester 08/23/2019    Concerns addressed today  Delivery Plans:  Plans to deliver at [facility].   MyChart/Babyscripts MyChart access verified. I explained pt will have some visits in office and some virtually. Babyscripts instructions given and order placed. Patient verifies receipt of registration text/e-mail. Account successfully created and app downloaded.  Blood Pressure Cuff  Pt has blood pressure cuff. Explained after first prenatal appt pt will check weekly and document in Babyscripts.  Anatomy US Explained first scheduled [place] will be around 19 weeks.   Labs Discussed Avelina Laine genetic screening with patient. Would like both Panorama and Horizon drawn at new OB visit. Routine prenatal labs needed.   Placed OB Box on problem list and updated   Patient informed that the ultrasound is considered a limited obstetric ultrasound and is not intended to be a complete ultrasound exam.  Patient also informed that the ultrasound is not being completed with the intent of assessing for fetal or placental anomalies or any pelvic abnormalities. Explained that the purpose of today's ultrasound is to assess for dating and fetal heart rate.  Patient acknowledges the purpose of the exam and the limitations of the study.      First visit review I reviewed new OB appt with pt. I explained she will have ob bloodwork with genetic screening. Explained pt will be seen by Dr Shawnie Pons at first visit; encounter routed to appropriate provider. Explained that patient will be seen by pregnancy navigator following visit with  provider.     Scheryl Marten, RN 08/11/2021  2:05 PM

## 2021-09-17 ENCOUNTER — Other Ambulatory Visit: Payer: Self-pay

## 2021-09-17 ENCOUNTER — Encounter: Payer: Self-pay | Admitting: Family Medicine

## 2021-09-17 ENCOUNTER — Other Ambulatory Visit (HOSPITAL_COMMUNITY)
Admission: RE | Admit: 2021-09-17 | Discharge: 2021-09-17 | Disposition: A | Discharge status: Home / Self Care | Payer: Medicaid Other | Source: Ambulatory Visit | Attending: Family Medicine | Admitting: Family Medicine

## 2021-09-17 ENCOUNTER — Ambulatory Visit (INDEPENDENT_AMBULATORY_CARE_PROVIDER_SITE_OTHER): Payer: Medicaid Other | Admitting: Family Medicine

## 2021-09-17 VITALS — BP 123/82 | HR 75 | Wt 178.0 lb

## 2021-09-17 DIAGNOSIS — O133 Gestational [pregnancy-induced] hypertension without significant proteinuria, third trimester: Secondary | ICD-10-CM

## 2021-09-17 DIAGNOSIS — Z3480 Encounter for supervision of other normal pregnancy, unspecified trimester: Secondary | ICD-10-CM | POA: Insufficient documentation

## 2021-09-17 MED ORDER — ASPIRIN EC 81 MG PO TBEC: 81.0000 mg | DELAYED_RELEASE_TABLET | Freq: Every day | ORAL | 3 refills | Status: DC

## 2021-09-17 NOTE — Progress Notes (Signed)
Subjective:   Gabrielle Fernandez is a 25 y.o. G2P1001 at [redacted]w[redacted]d by LMP, early ultrasound being seen today for her first obstetrical visit.  Her obstetrical history is significant for  gestational hypertension . Patient does intend to breast feed. Pregnancy history fully reviewed.  Patient reports nausea.  HISTORY: OB History  Gravida Para Term Preterm AB Living  2 1 1  0 0 1  SAB IAB Ectopic Multiple Live Births  0 0 0 0 1    # Outcome Date GA Lbr Len/2nd Weight Sex Delivery Anes PTL Lv  2 Current           1 Term 08/25/19 [redacted]w[redacted]d 07:38 / 00:36 6 lb 11.8 oz (3.055 kg) F Vag-Spont EPI, Local  LIV     Name: SONG, MYRE     Apgar1: 9  Apgar5: 9   Last pap smear was  02/2019 and was normal Past Medical History:  Diagnosis Date   Acne    gestational hypertension    Medical history non-contributory    Past Surgical History:  Procedure Laterality Date   APPENDECTOMY     LAPAROSCOPIC APPENDECTOMY N/A 09/01/2016   Procedure: APPENDECTOMY LAPAROSCOPIC;  Surgeon: 09/03/2016, MD;  Location: [facility] ORS;  Service: General;  Laterality: N/A;   Family History  Problem Relation Age of Onset   Hypertension Mother    Hypertension Father    Social History   Tobacco Use   Smoking status: Never   Smokeless tobacco: Never  Vaping Use   Vaping Use: Never used  Substance Use Topics   Alcohol use: Not Currently   Drug use: No   No Known Allergies Current Outpatient Medications on File Prior to Visit  Medication Sig Dispense Refill   promethazine (PHENERGAN) 25 MG tablet Take 1 tablet (25 mg total) by mouth every 6 (six) hours as needed for nausea or vomiting. 30 tablet 2   Doxylamine-Pyridoxine (DICLEGIS) 10-10 MG TBEC Take 2 tablets by mouth at bedtime. If symptoms persist, add one tablet in the morning and one in the afternoon 100 tablet 5   Prenatal Vit-Fe Fumarate-FA (MULTIVITAMIN-PRENATAL) 27-0.8 MG TABS tablet Take 1 tablet by mouth daily at 12 noon.     [DISCONTINUED]  Blood Pressure Monitoring (BLOOD PRESSURE CUFF) MISC 1 Device by Does not apply route once a week. To be monitored weekly at home, please 1 each 0   [DISCONTINUED] hydrochlorothiazide (MICROZIDE) 12.5 MG capsule Take 1 capsule (12.5 mg total) by mouth daily for 3 days. 3 capsule 0   No current facility-administered medications on file prior to visit.     Exam   Vitals:   09/17/21 0930  BP: 123/82  Pulse: 75  Weight: 178 lb (80.7 kg)   Fetal Heart Rate (bpm): 120  Uterus:   12 weeks size  Pelvic Exam: Perineum: no hemorrhoids, normal perineum   Vulva: normal external genitalia, no lesions   Vagina:  normal mucosa, normal discharge   Cervix: no lesions and normal, pap smear done.    Adnexa: normal adnexa and no mass, fullness, tenderness   Bony Pelvis: average  System: General: well-developed, well-nourished female in no acute distress   Breast:  normal appearance, no masses or tenderness   Skin: normal coloration and turgor, no rashes   Neurologic: oriented, normal, negative, normal mood   Extremities: normal strength, tone, and muscle mass, ROM of all joints is normal   HEENT PERRLA, extraocular movement intact and sclera clear, anicteric   Mouth/Teeth mucous membranes  moist, pharynx normal without lesions and dental hygiene good   Neck supple and no masses   Cardiovascular: regular rate and rhythm   Respiratory:  no respiratory distress, normal breath sounds   Abdomen: soft, non-tender; bowel sounds normal; no masses,  no organomegaly     Assessment:   Pregnancy: G2P1001 Patient Active Problem List   Diagnosis Date Noted   Supervision of other normal pregnancy, antepartum 08/11/2021   Gestational hypertension, third trimester 08/23/2019     Plan:  1. Supervision of other normal pregnancy, antepartum New OB labs + genetics - CBC/D/Plt+RPR+Rh+ABO+RubIgG... - Genetic Screening - Culture, OB Urine - Cervicovaginal ancillary only( [place]) - [place] MFM OB COMP + 14  WK; Future  2. Gestational hypertension, third trimester Begin ASA as preventive - aspirin EC 81 MG tablet; Take 1 tablet (81 mg total) by mouth daily.  Dispense: 90 tablet; Refill: 3   Initial labs drawn. Continue prenatal vitamins. Genetic Screening discussed, NIPS: ordered. Ultrasound discussed; fetal anatomic survey: ordered. Problem list reviewed and updated. The nature of [facility] with multiple MDs and other Advanced Practice Providers was explained to patient; also emphasized that residents, students are part of our team. Routine obstetric precautions reviewed. No follow-ups on file.

## 2021-09-18 LAB — CBC/D/PLT+RPR+RH+ABO+RUBIGG...
Antibody Screen: NEGATIVE
Basophils Absolute: 0 10*3/uL (ref 0.0–0.2)
Basos: 0 %
EOS (ABSOLUTE): 0 10*3/uL (ref 0.0–0.4)
Eos: 1 %
HCV Ab: 0.2 s/co ratio (ref 0.0–0.9)
HIV Screen 4th Generation wRfx: NONREACTIVE
Hematocrit: 38 % (ref 34.0–46.6)
Hemoglobin: 13.1 g/dL (ref 11.1–15.9)
Hepatitis B Surface Ag: NEGATIVE
Immature Grans (Abs): 0 10*3/uL (ref 0.0–0.1)
Immature Granulocytes: 0 %
Lymphocytes Absolute: 1.3 10*3/uL (ref 0.7–3.1)
Lymphs: 26 %
MCH: 29.9 pg (ref 26.6–33.0)
MCHC: 34.5 g/dL (ref 31.5–35.7)
MCV: 87 fL (ref 79–97)
Monocytes Absolute: 0.3 10*3/uL (ref 0.1–0.9)
Monocytes: 5 %
Neutrophils Absolute: 3.4 10*3/uL (ref 1.4–7.0)
Neutrophils: 68 %
Platelets: 223 10*3/uL (ref 150–450)
RBC: 4.38 x10E6/uL (ref 3.77–5.28)
RDW: 11.9 % (ref 11.7–15.4)
RPR Ser Ql: NONREACTIVE
Rh Factor: POSITIVE
Rubella Antibodies, IGG: 5.21 index (ref >0.99)
WBC: 5 10*3/uL (ref 3.4–10.8)

## 2021-09-18 LAB — CERVICOVAGINAL ANCILLARY ONLY
Chlamydia: NEGATIVE
Comment: NEGATIVE
Comment: NORMAL
Neisseria Gonorrhea: NEGATIVE

## 2021-09-18 LAB — HCV INTERPRETATION

## 2021-09-19 LAB — CULTURE, OB URINE

## 2021-09-19 LAB — URINE CULTURE, OB REFLEX

## 2021-09-24 ENCOUNTER — Encounter: Payer: Self-pay | Admitting: Radiology

## 2021-09-24 ENCOUNTER — Telehealth: Payer: Self-pay | Admitting: *Deleted

## 2021-09-24 NOTE — Telephone Encounter (Signed)
Attempted to call pt to give panorama results, no answer and voicemail full, will send mychart message

## 2021-09-24 NOTE — Telephone Encounter (Signed)
Informed pt of panorama results and fetal sex.

## 2021-09-30 ENCOUNTER — Telehealth: Payer: Self-pay | Admitting: *Deleted

## 2021-09-30 ENCOUNTER — Encounter: Payer: Self-pay | Admitting: Radiology

## 2021-09-30 NOTE — Telephone Encounter (Signed)
Pt informed of Horizon results

## 2021-10-01 ENCOUNTER — Encounter: Payer: Self-pay | Admitting: Family Medicine

## 2021-10-05 NOTE — L&D Delivery Note (Addendum)
OB/GYN Faculty Practice Delivery Note  Gabrielle Fernandez is a 26 y.o. [id number] s/p SVD at [redacted]w[redacted]d. She was admitted for eIOL.   ROM: 7h 47m with clear fluid GBS Status: Negative  Delivery Date/Time: 03/26/22 at 1912  Delivery: Called to room and patient was complete and pushing. Head delivered middle occiput anterior. No nuchal cord present. Shoulders and body delivered in usual fashion. Infant with spontaneous cry, placed on mother's abdomen, dried and stimulated. Cord clamped x 2 after 1-minute delay and cut by FOB under direct supervision. Cord blood drawn. Placenta delivered spontaneously with gentle cord traction. Fundus firm with massage and Pitocin. Labia, perineum, vagina, and cervix were inspected, and a small first degree perineal laceration was identified that was hemostatic and not repaired. Of note, a well circumscribed, 1 cm x 1 cm vaginal cyst was identified in the floor of the vagina just within the introitus. Discussed with attending Dr. Vergie Living. Will plan to reassess at postpartum visit.   Placenta: Intact, 3 vessel cord - sent to L&D Complications: None Lacerations: 1st degree perineal   EBL: 45 cc Analgesia: Epidural  Infant: Viable female Nancy Marus III)  APGARs 8/9  Weight pending   Gilles Chiquito, MD PGY-2  GME ATTESTATION:  I saw and evaluated the patient. I was present, gowned, and gloved for the entire delivery and management of the patient. I agree with the findings and the plan of care as documented in the resident's note. I have made changes to documentation as necessary.  Evalina Field, MD OB Fellow, Faculty Practice [facility], [facility] for [facility]

## 2021-10-06 ENCOUNTER — Encounter: Payer: Self-pay | Admitting: Family Medicine

## 2021-10-22 ENCOUNTER — Telehealth: Payer: Medicaid Other | Admitting: Family Medicine

## 2021-10-23 ENCOUNTER — Telehealth: Payer: Medicaid Other | Admitting: Obstetrics and Gynecology

## 2021-10-23 ENCOUNTER — Telehealth: Payer: Self-pay

## 2021-10-23 NOTE — Telephone Encounter (Signed)
Called 3 times to do mychart visit no answer unable to leave a message

## 2021-10-30 ENCOUNTER — Ambulatory Visit: Payer: Medicaid Other | Attending: Family Medicine

## 2021-10-30 ENCOUNTER — Encounter: Payer: Self-pay | Admitting: *Deleted

## 2021-10-30 ENCOUNTER — Ambulatory Visit: Payer: Medicaid Other | Admitting: *Deleted

## 2021-10-30 ENCOUNTER — Other Ambulatory Visit: Payer: Self-pay | Admitting: *Deleted

## 2021-10-30 ENCOUNTER — Other Ambulatory Visit: Payer: Self-pay

## 2021-10-30 VITALS — BP 124/69 | HR 84

## 2021-10-30 DIAGNOSIS — O132 Gestational [pregnancy-induced] hypertension without significant proteinuria, second trimester: Secondary | ICD-10-CM | POA: Insufficient documentation

## 2021-10-30 DIAGNOSIS — Z8759 Personal history of other complications of pregnancy, childbirth and the puerperium: Secondary | ICD-10-CM

## 2021-10-30 DIAGNOSIS — Z3480 Encounter for supervision of other normal pregnancy, unspecified trimester: Secondary | ICD-10-CM | POA: Diagnosis not present

## 2021-10-30 DIAGNOSIS — Z363 Encounter for antenatal screening for malformations: Secondary | ICD-10-CM | POA: Insufficient documentation

## 2021-10-30 DIAGNOSIS — O321XX0 Maternal care for breech presentation, not applicable or unspecified: Secondary | ICD-10-CM | POA: Insufficient documentation

## 2021-10-30 DIAGNOSIS — Z3A19 19 weeks gestation of pregnancy: Secondary | ICD-10-CM | POA: Insufficient documentation

## 2021-10-30 DIAGNOSIS — O283 Abnormal ultrasonic finding on antenatal screening of mother: Secondary | ICD-10-CM

## 2021-11-27 ENCOUNTER — Other Ambulatory Visit: Payer: Self-pay

## 2021-11-27 ENCOUNTER — Ambulatory Visit: Payer: Medicaid Other | Attending: Obstetrics

## 2021-11-27 ENCOUNTER — Other Ambulatory Visit: Payer: Self-pay | Admitting: *Deleted

## 2021-11-27 ENCOUNTER — Ambulatory Visit: Payer: Medicaid Other | Admitting: *Deleted

## 2021-11-27 ENCOUNTER — Encounter: Payer: Self-pay | Admitting: *Deleted

## 2021-11-27 VITALS — BP 121/64 | HR 64

## 2021-11-27 DIAGNOSIS — Z362 Encounter for other antenatal screening follow-up: Secondary | ICD-10-CM | POA: Diagnosis present

## 2021-11-27 DIAGNOSIS — Z8759 Personal history of other complications of pregnancy, childbirth and the puerperium: Secondary | ICD-10-CM

## 2021-11-27 DIAGNOSIS — O321XX0 Maternal care for breech presentation, not applicable or unspecified: Secondary | ICD-10-CM | POA: Insufficient documentation

## 2021-11-27 DIAGNOSIS — Z3480 Encounter for supervision of other normal pregnancy, unspecified trimester: Secondary | ICD-10-CM

## 2021-11-27 DIAGNOSIS — Z3A23 23 weeks gestation of pregnancy: Secondary | ICD-10-CM | POA: Insufficient documentation

## 2021-11-27 DIAGNOSIS — O09292 Supervision of pregnancy with other poor reproductive or obstetric history, second trimester: Secondary | ICD-10-CM | POA: Diagnosis not present

## 2021-11-27 DIAGNOSIS — O283 Abnormal ultrasonic finding on antenatal screening of mother: Secondary | ICD-10-CM

## 2021-11-27 DIAGNOSIS — O35BXX0 Maternal care for other (suspected) fetal abnormality and damage, fetal cardiac anomalies, not applicable or unspecified: Secondary | ICD-10-CM

## 2021-12-10 ENCOUNTER — Ambulatory Visit (INDEPENDENT_AMBULATORY_CARE_PROVIDER_SITE_OTHER): Payer: Medicaid Other | Admitting: Family Medicine

## 2021-12-10 ENCOUNTER — Other Ambulatory Visit: Payer: Self-pay

## 2021-12-10 ENCOUNTER — Encounter: Payer: Self-pay | Admitting: Family Medicine

## 2021-12-10 VITALS — BP 137/81 | HR 69 | Wt 193.4 lb

## 2021-12-10 DIAGNOSIS — O35BXX0 Maternal care for other (suspected) fetal abnormality and damage, fetal cardiac anomalies, not applicable or unspecified: Secondary | ICD-10-CM | POA: Insufficient documentation

## 2021-12-10 DIAGNOSIS — Z8759 Personal history of other complications of pregnancy, childbirth and the puerperium: Secondary | ICD-10-CM

## 2021-12-10 DIAGNOSIS — Z3480 Encounter for supervision of other normal pregnancy, unspecified trimester: Secondary | ICD-10-CM

## 2021-12-10 NOTE — Progress Notes (Signed)
? ?  PRENATAL VISIT NOTE ? ?Subjective:  ?Gabrielle Fernandez is a 26 y.o. G2P1001 at [redacted]w[redacted]d being seen today for ongoing prenatal care.  She is currently monitored for the following issues for this low-risk pregnancy and has History of gestational hypertension; Supervision of other normal pregnancy, antepartum; and Fetal membranous VSD on their problem list. ? ?Patient reports no complaints.  Contractions: Irritability. Vag. Bleeding: None.  Movement: Present. Denies leaking of fluid.  ? ?The following portions of the patient's history were reviewed and updated as appropriate: allergies, current medications, past family history, past medical history, past social history, past surgical history and problem list.  ? ?Objective:  ? ?Vitals:  ? 12/10/21 0941  ?BP: 137/81  ?Pulse: 69  ?Weight: 193 lb 6.4 oz (87.7 kg)  ? ? ?Fetal Status: Fetal Heart Rate (bpm): 138 Fundal Height: 25 cm Movement: Present    ? ?General:  Alert, oriented and cooperative. Patient is in no acute distress.  ?Skin: Skin is warm and dry. No rash noted.   ?Cardiovascular: Normal heart rate noted  ?Respiratory: Normal respiratory effort, no problems with respiration noted  ?Abdomen: Soft, gravid, appropriate for gestational age.  Pain/Pressure: Absent     ?Pelvic: Cervical exam deferred        ?Extremities: Normal range of motion.     ?Mental Status: Normal mood and affect. Normal behavior. Normal judgment and thought content.  ? ?Assessment and Plan:  ?Pregnancy: G2P1001 at [redacted]w[redacted]d ?1. Supervision of other normal pregnancy, antepartum ?Continue routine prenatal care. ? ? ?2. Pregnancy complicated by fetal congenital heart disease, single or unspecified fetus ?Small VSD ? ?3. History of gestational hypertension ?Resume her ASA ? ?Preterm labor symptoms and general obstetric precautions including but not limited to vaginal bleeding, contractions, leaking of fluid and fetal movement were reviewed in detail with the patient. ?Please refer to After Visit  Summary for other counseling recommendations.  ? ?Return in 4 weeks (on 01/07/2022) for [facility], 28 wk labs. ? ?Future Appointments  ?Date Time Provider Department Center  ?01/01/2022  9:15 AM [facility] NURSE [facility]  ?01/01/2022  9:30 AM [facility] US3 [facility]  ?01/08/2022  8:15 AM [facility] LAB [facility]  ?01/08/2022 10:35 AM Calvert Cantor, CNM [facility]  ? ? ?Reva Bores, MD ? ?

## 2022-01-01 ENCOUNTER — Other Ambulatory Visit: Payer: Self-pay | Admitting: *Deleted

## 2022-01-01 ENCOUNTER — Ambulatory Visit: Payer: Medicaid Other | Admitting: *Deleted

## 2022-01-01 ENCOUNTER — Encounter: Payer: Self-pay | Admitting: *Deleted

## 2022-01-01 ENCOUNTER — Ambulatory Visit: Payer: Medicaid Other | Attending: Maternal & Fetal Medicine

## 2022-01-01 VITALS — BP 112/72 | HR 65

## 2022-01-01 DIAGNOSIS — Z3480 Encounter for supervision of other normal pregnancy, unspecified trimester: Secondary | ICD-10-CM

## 2022-01-01 DIAGNOSIS — Z3A28 28 weeks gestation of pregnancy: Secondary | ICD-10-CM

## 2022-01-01 DIAGNOSIS — Z8759 Personal history of other complications of pregnancy, childbirth and the puerperium: Secondary | ICD-10-CM

## 2022-01-01 DIAGNOSIS — O09293 Supervision of pregnancy with other poor reproductive or obstetric history, third trimester: Secondary | ICD-10-CM | POA: Insufficient documentation

## 2022-01-01 DIAGNOSIS — O35BXX0 Maternal care for other (suspected) fetal abnormality and damage, fetal cardiac anomalies, not applicable or unspecified: Secondary | ICD-10-CM | POA: Diagnosis not present

## 2022-01-01 DIAGNOSIS — O289 Unspecified abnormal findings on antenatal screening of mother: Secondary | ICD-10-CM | POA: Diagnosis present

## 2022-01-08 ENCOUNTER — Ambulatory Visit (INDEPENDENT_AMBULATORY_CARE_PROVIDER_SITE_OTHER): Payer: Medicaid Other | Admitting: Advanced Practice Midwife

## 2022-01-08 ENCOUNTER — Other Ambulatory Visit: Payer: Medicaid Other

## 2022-01-08 VITALS — BP 118/77 | HR 84 | Wt 201.4 lb

## 2022-01-08 DIAGNOSIS — Z23 Encounter for immunization: Secondary | ICD-10-CM

## 2022-01-08 DIAGNOSIS — Z3A28 28 weeks gestation of pregnancy: Secondary | ICD-10-CM

## 2022-01-08 DIAGNOSIS — Z3480 Encounter for supervision of other normal pregnancy, unspecified trimester: Secondary | ICD-10-CM

## 2022-01-08 DIAGNOSIS — O35BXX0 Maternal care for other (suspected) fetal abnormality and damage, fetal cardiac anomalies, not applicable or unspecified: Secondary | ICD-10-CM

## 2022-01-08 DIAGNOSIS — Z3483 Encounter for supervision of other normal pregnancy, third trimester: Secondary | ICD-10-CM | POA: Diagnosis not present

## 2022-01-08 DIAGNOSIS — Z8759 Personal history of other complications of pregnancy, childbirth and the puerperium: Secondary | ICD-10-CM

## 2022-01-08 LAB — CBC
Hematocrit: 36.4 % (ref 34.0–46.6)
Hemoglobin: 12.3 g/dL (ref 11.1–15.9)
MCH: 30.1 pg (ref 26.6–33.0)
MCHC: 33.8 g/dL (ref 31.5–35.7)
MCV: 89 fL (ref 79–97)
Platelets: 206 10*3/uL (ref 150–450)
RBC: 4.09 x10E6/uL (ref 3.77–5.28)
RDW: 12.4 % (ref 11.7–15.4)
WBC: 6.7 10*3/uL (ref 3.4–10.8)

## 2022-01-08 NOTE — Progress Notes (Signed)
? ?  PRENATAL VISIT NOTE ? ?Subjective:  ?Gabrielle Fernandez is a 26 y.o. G2P1001 at [redacted]w[redacted]d being seen today for ongoing prenatal care.  She is currently monitored for the following issues for this low-risk pregnancy and has History of gestational hypertension; Supervision of other normal pregnancy, antepartum; and Fetal membranous VSD on their problem list. ? ?Patient reports no complaints.  Contractions: Not present. Vag. Bleeding: None.  Movement: Present. Denies leaking of fluid.  ? ?The following portions of the patient's history were reviewed and updated as appropriate: allergies, current medications, past family history, past medical history, past social history, past surgical history and problem list. Problem list updated. ? ?Objective:  ? ?Vitals:  ? 01/08/22 0816  ?BP: 118/77  ?Pulse: 84  ?Weight: 201 lb 6.4 oz (91.4 kg)  ? ? ?Fetal Status: Fetal Heart Rate (bpm): 142   Movement: Present    ? ?General:  Alert, oriented and cooperative. Patient is in no acute distress.  ?Skin: Skin is warm and dry. No rash noted.   ?Cardiovascular: Normal heart rate noted  ?Respiratory: Normal respiratory effort, no problems with respiration noted  ?Abdomen: Soft, gravid, appropriate for gestational age.  Pain/Pressure: Absent     ?Pelvic: Cervical exam deferred        ?Extremities: Normal range of motion.  Edema: None  ?Mental Status: Normal mood and affect. Normal behavior. Normal judgment and thought content.  ? ?Assessment and Plan:  ?Pregnancy: G2P1001 at [redacted]w[redacted]d ? ?1. Supervision of other normal pregnancy, antepartum ?- Routine care ? ?2. Pregnancy complicated by fetal congenital heart disease, single or unspecified fetus ?- VSD, scheduled for repeat imaging ? ?3. History of gestational hypertension ?- Normotensive in current pregnancy ? ?4. [redacted] weeks gestation of pregnancy ? ?- Tdap vaccine greater than or equal to 7yo IM ? ?Preterm labor symptoms and general obstetric precautions including but not limited to vaginal  bleeding, contractions, leaking of fluid and fetal movement were reviewed in detail with the patient. ?Please refer to After Visit Summary for other counseling recommendations.  ?Return in about 2 weeks (around 01/22/2022) for MD or CNM please. ? ?Future Appointments  ?Date Time Provider Department Center  ?01/28/2022 10:35 AM Reva Bores, MD [facility]  ?02/11/2022  9:35 AM Reva Bores, MD [facility]  ?02/12/2022  9:15 AM [facility] NURSE [facility]  ?02/12/2022  9:30 AM [facility] US3 [facility]  ?02/25/2022  8:55 AM Reva Bores, MD [facility]  ?03/11/2022  8:35 AM Reva Bores, MD [facility]  ?03/19/2022  8:35 AM Calvert Cantor, CNM [facility]  ?03/25/2022  8:55 AM Reva Bores, MD [facility]  ?04/01/2022  8:35 AM Anyanwu, Jethro Bastos, MD [facility]  ? ? ?Calvert Cantor, CNM ? ?

## 2022-01-09 LAB — GLUCOSE TOLERANCE, 2 HOURS W/ 1HR
Glucose, 1 hour: 122 mg/dL (ref 70–179)
Glucose, 2 hour: 57 mg/dL — ABNORMAL LOW (ref 70–152)
Glucose, Fasting: 71 mg/dL (ref 70–91)

## 2022-01-09 LAB — RPR: RPR Ser Ql: NONREACTIVE

## 2022-01-09 LAB — HIV ANTIBODY (ROUTINE TESTING W REFLEX): HIV Screen 4th Generation wRfx: NONREACTIVE

## 2022-01-28 ENCOUNTER — Encounter: Payer: Self-pay | Admitting: Family Medicine

## 2022-01-28 ENCOUNTER — Ambulatory Visit (INDEPENDENT_AMBULATORY_CARE_PROVIDER_SITE_OTHER): Payer: Medicaid Other | Admitting: Family Medicine

## 2022-01-28 VITALS — BP 124/84 | HR 78 | Wt 203.0 lb

## 2022-01-28 DIAGNOSIS — O35BXX0 Maternal care for other (suspected) fetal abnormality and damage, fetal cardiac anomalies, not applicable or unspecified: Secondary | ICD-10-CM

## 2022-01-28 DIAGNOSIS — Z3480 Encounter for supervision of other normal pregnancy, unspecified trimester: Secondary | ICD-10-CM

## 2022-01-28 DIAGNOSIS — Z8759 Personal history of other complications of pregnancy, childbirth and the puerperium: Secondary | ICD-10-CM

## 2022-01-28 NOTE — Patient Instructions (Signed)
Tercer trimestre de embarazo ?Third Trimester of Pregnancy ? ?El tercer trimestre de embarazo va desde la semana 28 hasta la semana 40. Esto corresponde a los meses 7 a 9. El tercer trimestre es un per?odo en el que el beb? en gestaci?n (feto) crece r?pidamente. Hacia el final del noveno mes, el feto mide alrededor de 20?pulgadas (45?cm) de largo y pesa entre 6 y 10 libras (2.7 y 4.5?kg). ?Cambios en el cuerpo durante el tercer trimestre ?Durante el tercer trimestre, su cuerpo contin?a experimentando numerosos cambios. Los cambios var?an y generalmente vuelven a la normalidad despu?s del nacimiento del beb?. ?Cambios f?sicos ?Seguir? aumentando de peso. Es de esperar que aumente entre 25 y 35?libras (11 y 16?kg) hacia el final del embarazo si inicia el embarazo con un peso normal. Si tiene bajo peso, es de esperar que aumente entre 28 y 40 libras (13 y?18 kg), y si tiene sobrepeso, es de esperar que aumente entre 15 y 25 libras (7 y 11?kg). ?Podr?n aparecer las primeras estr?as en las caderas, el abdomen y las mamas. ?Las mamas seguir?n creciendo y pueden doler. Un l?quido amarillo (calostro) puede salir de sus pechos. Esta es la primera leche que usted produce para su beb?. ?Tal vez haya cambios en el cabello. Esto cambios pueden incluir su engrosamiento, crecimiento r?pido y cambios en la textura. A algunas personas tambi?n se les cae el cabello durante o despu?s del embarazo, o tienen el cabello seco o fino. ?El ombligo puede salir hacia afuera. ?Puede observar que se le hinchan las manos, el rostro o los tobillos. ?Cambios en la salud ?Es posible que tenga acidez estomacal. ?Puede sufrir estre?imiento. ?Puede desarrollar hemorroides. ?Puede desarrollar venas hinchadas y abultadas (venas varicosas) en las piernas. ?Puede presentar m?s dolor en la pelvis, la espalda o los muslos. Esto se debe al aumento de peso y al aumento de las hormonas que relajan las articulaciones. ?Puede presentar un aumento del hormigueo o  entumecimiento en las manos, brazos y piernas. La piel de su abdomen tambi?n puede sentirse entumecida. ?Puede sentir que le falta el aire debido a que se expande el ?tero. ?Otros cambios ?Puede tener necesidad de orinar con m?s frecuencia porque el feto baja hacia la pelvis y ejerce presi?n sobre la vejiga. ?Puede tener m?s problemas para dormir. Esto puede deberse al tama?o de su abdomen, una mayor necesidad de orinar y un aumento en el metabolismo de su cuerpo. ?Puede notar que el feto ?baja? o lo siente m?s bajo, en el abdomen (aligeramiento). ?Puede tener un aumento de la secreci?n vaginal. ?Puede notar que tiene dolor alrededor del hueso p?lvico a medida que el ?tero se distiende. ?Siga estas instrucciones en su casa: ?Medicamentos ?Siga las instrucciones del m?dico en relaci?n con el uso de medicamentos. Durante el embarazo, hay medicamentos que pueden tomarse y otros que no. No tome ning?n medicamento a menos que lo haya autorizado el m?dico. ?Tome vitaminas prenatales que contengan por lo menos 600?microgramos (mcg) de ?cido f?lico. ?Comida y bebida ?Lleve una dieta saludable que incluya frutas y verduras frescas, cereales integrales, buenas fuentes de prote?nas como carnes magras, huevos o tofu, y productos l?cteos descremados. ?Evite la carne cruda y el jugo, la leche y el queso sin pasteurizar. Estos portan g?rmenes que pueden provocar da?o tanto a usted como al beb?. ?Tome 4 o 5 comidas peque?as en lugar de 3 comidas abundantes al d?a. ?Es posible que tenga que tomar estas medidas para prevenir o tratar el estre?imiento: ?Beber suficiente l?quido como para mantener la   orina de color amarillo p?lido. ?Consumir alimentos ricos en fibra, como frijoles, cereales integrales, y frutas y verduras frescas. ?Limitar el consumo de alimentos ricos en grasa y az?cares procesados, como los alimentos fritos o dulces. ?Actividad ?Haga ejercicio solamente como se lo haya indicado el m?dico. La mayor?a de las personas  pueden continuar su actividad f?sica habitual durante el embarazo. Intente realizar como m?nimo 30?minutos de actividad f?sica por lo menos 5?d?as a la semana. Deje de hacer ejercicio si experimenta contracciones en el ?tero. ?Deje de hacer ejercicio si le aparecen dolor o c?licos en la parte baja del vientre o de la espalda. ?Evite levantar pesos excesivos. ?No haga ejercicio si hace mucho calor o humedad, o si se encuentra a una altitud elevada. ?Si lo desea, puede seguir teniendo relaciones sexuales, salvo que el m?dico le indique lo contrario. ?Alivio del dolor y del malestar ?Haga pausas frecuentes y descanse con las piernas levantadas (elevadas) si tiene calambres en las piernas o dolor en la parte baja de la espalda. ?Dese ba?os de asiento con agua tibia para aliviar el dolor o las molestias causadas por las hemorroides. Use una crema para las hemorroides si el m?dico la autoriza. ?Use un sujetador que le brinde buen soporte para prevenir las molestias causadas por la sensibilidad en las mamas. ?Si tiene venas varicosas: ?Use medias de compresi?n como se lo haya indicado el m?dico. ?Eleve los pies durante 15?minutos, 3 o 4?veces por d?a. ?Limite el consumo de sal en su dieta. ?Seguridad ?Hable con su m?dico antes de viajar distancias largas. ?No se d? ba?os de inmersi?n en agua caliente, ba?os turcos ni saunas. ?Use el cintur?n de seguridad en todo momento mientras conduce o va en auto. ?Hable con el m?dico si es v?ctima de maltrato verbal o f?sico. ?Preparaci?n para el nacimiento ?Para prepararse para la llegada de su beb?: ?Tome clases prenatales para entender, practicar, y hacer preguntas sobre el trabajo de parto y el parto. ?Visite el hospital y recorra el ?rea de maternidad. ?Compre un asiento de seguridad orientado hacia atr?s, y aseg?rese de saber c?mo instalarlo en su autom?vil. ?Prepare la habitaci?n o el lugar donde dormir? el beb?. Aseg?rese de quitar todas las almohadas y animales de peluche de  la cuna del beb? para evitar la asfixia. ?Indicaciones generales ?Evite el contacto con las bandejas sanitarias de los gatos y la tierra que estos animales usan. Estos alimentos contienen bacterias que pueden causar defectos cong?nitos en el beb?. Si tiene un gato, p?dale a alguien que limpie la caja de arena por usted. ?No se haga lavados vaginales ni use tampones. No use toallas higi?nicas perfumadas. ?No consuma ning?n producto que contenga nicotina o tabaco, como cigarrillos, cigarrillos electr?nicos y tabaco de mascar. Si necesita ayuda para dejar de consumir estos productos, consulte al m?dico. ?No use ning?n remedio a base de hierbas, drogas ilegales o medicamentos que no le hayan sido recetados. Las sustancias qu?micas de estos productos pueden da?ar al beb?. ?No beba alcohol. ?Le realizar?n ex?menes prenatales m?s frecuentes durante el tercer trimestre. Durante una visita prenatal de rutina, el m?dico le har? un examen f?sico, le realizar? pruebas y hablar? con usted de su salud general. Cumpla con todas las visitas de seguimiento. Esto es importante. ?D?nde buscar m?s informaci?n ?American Pregnancy Association (Asociaci?n Estadounidense del Embarazo): americanpregnancy.org ?American College of Obstetricians and Gynecologists (Colegio Estadounidense de Obstetras y Ginec?logos): acog.org/womens-health/pregnancy? ?Office on Women's Health (Oficina para la Salud de la Mujer): womenshealth.gov/pregnancy ?Comun?quese con un m?dico si tiene: ?Fiebre. ?C?licos leves en   la pelvis, presi?n en la pelvis o dolor persistente en la zona abdominal o la parte baja de la espalda. ?V?mitos o diarrea. ?Secreci?n vaginal con mal olor u orina con mal olor. ?Dolor al orinar. ?Un dolor de cabeza que no desaparece despu?s de tomar analg?sicos. ?Cambios en la visi?n o ve manchas delante de los ojos. ?Solicite ayuda de inmediato si: ?Rompe la bolsa. ?Tiene contracciones regulares separadas por menos de 5?minutos. ?Tiene sangrado o  peque?as p?rdidas vaginales. ?Siente un dolor abdominal intenso. ?Tiene dificultad para respirar. ?Siente dolor en el pecho. ?Sufre episodios de desmayo. ?No ha sentido a su beb? moverse durante el pe

## 2022-01-28 NOTE — Progress Notes (Addendum)
ROB [redacted]w[redacted]d ? ?CC: None  ? ? ?Repeated B/P 124/84 P: 78 ? ?

## 2022-01-28 NOTE — Progress Notes (Signed)
? ? ?  PRENATAL VISIT NOTE ? ?Subjective:  ?Gabrielle Fernandez is a 26 y.o. G2P1001 at [redacted]w[redacted]d being seen today for ongoing prenatal care.  She is currently monitored for the following issues for this low-risk pregnancy and has History of gestational hypertension; Supervision of other normal pregnancy, antepartum; and Fetal membranous VSD on their problem list. ? ?Patient reports no complaints.  Contractions: Not present. Vag. Bleeding: None.  Movement: Present. Denies leaking of fluid.  ? ?The following portions of the patient's history were reviewed and updated as appropriate: allergies, current medications, past family history, past medical history, past social history, past surgical history and problem list.  ? ?Objective:  ? ?Vitals:  ? 01/28/22 1112 01/28/22 1149  ?BP: 140/88 124/84  ?Pulse: 78 78  ?Weight: 203 lb (92.1 kg)   ? ? ?Fetal Status: Fetal Heart Rate (bpm): 144 Fundal Height: 31 cm Movement: Present    ? ?General:  Alert, oriented and cooperative. Patient is in no acute distress.  ?Skin: Skin is warm and dry. No rash noted.   ?Cardiovascular: Normal heart rate noted  ?Respiratory: Normal respiratory effort, no problems with respiration noted  ?Abdomen: Soft, gravid, appropriate for gestational age.  Pain/Pressure: Present     ?Pelvic: Cervical exam deferred        ?Extremities: Normal range of motion.  Edema: None  ?Mental Status: Normal mood and affect. Normal behavior. Normal judgment and thought content.  ? ?Assessment and Plan:  ?Pregnancy: G2P1001 at [redacted]w[redacted]d ?1. Supervision of other normal pregnancy, antepartum ?Continue routine prenatal care. ? ?2. Pregnancy complicated by fetal congenital heart disease, single or unspecified fetus ?Last fetal ECHO was WNL--recommended fetal echo if murmur ? ?3. History of gestational hypertension ?Inititial BP is elevated--F/u WNL will trend ? ?Preterm labor symptoms and general obstetric precautions including but not limited to vaginal bleeding, contractions,  leaking of fluid and fetal movement were reviewed in detail with the patient. ?Please refer to After Visit Summary for other counseling recommendations.  ? ?Return in 2 weeks (on 02/11/2022). ? ?Future Appointments  ?Date Time Provider [place]  ?02/11/2022  9:35 AM Donnamae Jude, MD [facility]  ?02/12/2022  9:15 AM [facility] NURSE [facility]  ?02/12/2022  9:30 AM [facility] US3 [facility]  ?02/25/2022  8:55 AM Donnamae Jude, MD [facility]  ?03/11/2022  8:35 AM Donnamae Jude, MD [facility]  ?03/19/2022  8:35 AM Darlina Rumpf, CNM [facility]  ?03/25/2022  8:55 AM Donnamae Jude, MD [facility]  ?04/01/2022  8:35 AM Anyanwu, Sallyanne Havers, MD [facility]  ? ? ?Donnamae Jude, MD ? ?

## 2022-02-11 ENCOUNTER — Encounter: Payer: Self-pay | Admitting: Family Medicine

## 2022-02-11 ENCOUNTER — Encounter: Payer: Medicaid Other | Admitting: Family Medicine

## 2022-02-11 ENCOUNTER — Telehealth: Payer: Self-pay

## 2022-02-11 NOTE — Progress Notes (Signed)
Patient did not keep appointment today. She will be called to reschedule.  

## 2022-02-11 NOTE — Telephone Encounter (Signed)
Called pt twice to check in for mychart visit. No answer.  ?

## 2022-02-12 ENCOUNTER — Encounter: Payer: Self-pay | Admitting: *Deleted

## 2022-02-12 ENCOUNTER — Ambulatory Visit: Payer: Medicaid Other | Admitting: *Deleted

## 2022-02-12 ENCOUNTER — Ambulatory Visit: Payer: Medicaid Other | Attending: Obstetrics and Gynecology

## 2022-02-12 ENCOUNTER — Other Ambulatory Visit: Payer: Self-pay | Admitting: *Deleted

## 2022-02-12 VITALS — BP 127/80 | HR 71

## 2022-02-12 DIAGNOSIS — Z3A34 34 weeks gestation of pregnancy: Secondary | ICD-10-CM | POA: Diagnosis not present

## 2022-02-12 DIAGNOSIS — O09293 Supervision of pregnancy with other poor reproductive or obstetric history, third trimester: Secondary | ICD-10-CM

## 2022-02-12 DIAGNOSIS — Z3689 Encounter for other specified antenatal screening: Secondary | ICD-10-CM

## 2022-02-12 DIAGNOSIS — Z8759 Personal history of other complications of pregnancy, childbirth and the puerperium: Secondary | ICD-10-CM | POA: Diagnosis present

## 2022-02-25 ENCOUNTER — Ambulatory Visit (INDEPENDENT_AMBULATORY_CARE_PROVIDER_SITE_OTHER): Payer: Medicaid Other | Admitting: Family Medicine

## 2022-02-25 VITALS — BP 124/85 | HR 80 | Wt 206.0 lb

## 2022-02-25 DIAGNOSIS — Z3480 Encounter for supervision of other normal pregnancy, unspecified trimester: Secondary | ICD-10-CM

## 2022-02-25 DIAGNOSIS — Z8759 Personal history of other complications of pregnancy, childbirth and the puerperium: Secondary | ICD-10-CM

## 2022-02-25 DIAGNOSIS — O35BXX0 Maternal care for other (suspected) fetal abnormality and damage, fetal cardiac anomalies, not applicable or unspecified: Secondary | ICD-10-CM

## 2022-02-25 NOTE — Progress Notes (Signed)
   PRENATAL VISIT NOTE  Subjective:  Gabrielle Fernandez is a 26 y.o. G2P1001 at [redacted]w[redacted]d being seen today for ongoing prenatal care.  She is currently monitored for the following issues for this high-risk pregnancy and has History of gestational hypertension; Supervision of other normal pregnancy, antepartum; and Fetal membranous VSD on their problem list.  Patient reports contractions since yesterday .  Contractions: Irregular. Vag. Bleeding: None.  Movement: Present. Denies leaking of fluid.   The following portions of the patient's history were reviewed and updated as appropriate: allergies, current medications, past family history, past medical history, past social history, past surgical history and problem list.   Objective:   Vitals:   02/25/22 0852  BP: 124/85  Pulse: 80  Weight: 206 lb (93.4 kg)    Fetal Status: Fetal Heart Rate (bpm): 128 Fundal Height: 32 cm Movement: Present     General:  Alert, oriented and cooperative. Patient is in no acute distress.  Skin: Skin is warm and dry. No rash noted.   Cardiovascular: Normal heart rate noted  Respiratory: Normal respiratory effort, no problems with respiration noted  Abdomen: Soft, gravid, appropriate for gestational age.  Pain/Pressure: Present     Pelvic: Cervical exam deferred        Extremities: Normal range of motion.  Edema: None  Mental Status: Normal mood and affect. Normal behavior. Normal judgment and thought content.   Assessment and Plan:  Pregnancy: G2P1001 at [redacted]w[redacted]d 1. Supervision of other normal pregnancy, antepartum With Preterm contractions She will continue to monitor--they have stopped this am. Cultures next week  2. History of gestational hypertension BP is good today Last u/s revealed EFW 13% for f/u in June  3. Pregnancy complicated by fetal congenital heart disease, single or unspecified fetus VSD closed on last ECHO  Preterm labor symptoms and general obstetric precautions including but not  limited to vaginal bleeding, contractions, leaking of fluid and fetal movement were reviewed in detail with the patient. Please refer to After Visit Summary for other counseling recommendations.   Return in 1 week (on 03/04/2022).  Future Appointments  Date Time Provider [place]  03/10/2022 10:30 AM [facility] NURSE [facility]  03/10/2022 10:45 AM [facility] US4 [facility]  03/11/2022  8:35 AM Donnamae Jude, MD [facility]  03/19/2022  8:35 AM Darlina Rumpf, CNM [facility]  03/25/2022  8:55 AM Donnamae Jude, MD [facility]  04/01/2022  8:35 AM Anyanwu, Sallyanne Havers, MD [facility]    Donnamae Jude, MD

## 2022-03-10 ENCOUNTER — Encounter: Payer: Self-pay | Admitting: *Deleted

## 2022-03-10 ENCOUNTER — Ambulatory Visit: Payer: Medicaid Other | Attending: Obstetrics and Gynecology

## 2022-03-10 ENCOUNTER — Ambulatory Visit: Payer: Medicaid Other | Admitting: *Deleted

## 2022-03-10 VITALS — BP 135/79 | HR 92

## 2022-03-10 DIAGNOSIS — Z3689 Encounter for other specified antenatal screening: Secondary | ICD-10-CM | POA: Insufficient documentation

## 2022-03-10 DIAGNOSIS — O35BXX0 Maternal care for other (suspected) fetal abnormality and damage, fetal cardiac anomalies, not applicable or unspecified: Secondary | ICD-10-CM

## 2022-03-10 DIAGNOSIS — O09293 Supervision of pregnancy with other poor reproductive or obstetric history, third trimester: Secondary | ICD-10-CM

## 2022-03-10 DIAGNOSIS — Z3A37 37 weeks gestation of pregnancy: Secondary | ICD-10-CM

## 2022-03-11 ENCOUNTER — Other Ambulatory Visit (HOSPITAL_COMMUNITY)
Admission: RE | Admit: 2022-03-11 | Discharge: 2022-03-11 | Disposition: A | Discharge status: Home / Self Care | Payer: Medicaid Other | Source: Ambulatory Visit | Attending: Family Medicine | Admitting: Family Medicine

## 2022-03-11 ENCOUNTER — Ambulatory Visit (INDEPENDENT_AMBULATORY_CARE_PROVIDER_SITE_OTHER): Payer: Medicaid Other | Admitting: Family Medicine

## 2022-03-11 VITALS — BP 128/85 | HR 77 | Wt 208.0 lb

## 2022-03-11 DIAGNOSIS — Z3A37 37 weeks gestation of pregnancy: Secondary | ICD-10-CM

## 2022-03-11 DIAGNOSIS — Z3480 Encounter for supervision of other normal pregnancy, unspecified trimester: Secondary | ICD-10-CM

## 2022-03-11 NOTE — Progress Notes (Signed)
   PRENATAL VISIT NOTE  Subjective:  Gabrielle Fernandez is a 26 y.o. G2P1001 at [redacted]w[redacted]d being seen today for ongoing prenatal care.  She is currently monitored for the following issues for this low-risk pregnancy and has History of gestational hypertension; Supervision of other normal pregnancy, antepartum; and Fetal membranous VSD on their problem list.  Patient reports no complaints.  Contractions: Irritability. Vag. Bleeding: None.  Movement: Present. Denies leaking of fluid.   The following portions of the patient's history were reviewed and updated as appropriate: allergies, current medications, past family history, past medical history, past social history, past surgical history and problem list.   Objective:   Vitals:   03/11/22 0839  BP: 128/85  Pulse: 77  Weight: 208 lb (94.3 kg)    Fetal Status: Fetal Heart Rate (bpm): 157 Fundal Height: 34 cm Movement: Present  Presentation: Vertex  General:  Alert, oriented and cooperative. Patient is in no acute distress.  Skin: Skin is warm and dry. No rash noted.   Cardiovascular: Normal heart rate noted  Respiratory: Normal respiratory effort, no problems with respiration noted  Abdomen: Soft, gravid, appropriate for gestational age.  Pain/Pressure: Present     Pelvic: Cervical exam performed in the presence of a chaperone Dilation: Fingertip Effacement (%): 60 Station: -2  Extremities: Normal range of motion.  Edema: Trace  Mental Status: Normal mood and affect. Normal behavior. Normal judgment and thought content.   Assessment and Plan:  Pregnancy: G2P1001 at [redacted]w[redacted]d 1. Supervision of other normal pregnancy, antepartum Cultures today Continue routine prenatal care. - Strep Gp B NAA - Cervicovaginal ancillary only( [place])  Term labor symptoms and general obstetric precautions including but not limited to vaginal bleeding, contractions, leaking of fluid and fetal movement were reviewed in detail with the patient. Please refer  to After Visit Summary for other counseling recommendations.   Return in 1 week (on 03/18/2022).  Future Appointments  Date Time Provider [place]  03/19/2022  8:35 AM Darlina Rumpf, CNM [facility]  03/25/2022  8:55 AM Donnamae Jude, MD [facility]  04/01/2022  8:35 AM Anyanwu, Sallyanne Havers, MD [facility]    Donnamae Jude, MD

## 2022-03-12 LAB — CERVICOVAGINAL ANCILLARY ONLY
Chlamydia: NEGATIVE
Comment: NEGATIVE
Comment: NORMAL
Neisseria Gonorrhea: NEGATIVE

## 2022-03-13 LAB — STREP GP B NAA: Strep Gp B NAA: NEGATIVE

## 2022-03-19 ENCOUNTER — Ambulatory Visit (INDEPENDENT_AMBULATORY_CARE_PROVIDER_SITE_OTHER): Payer: Medicaid Other | Admitting: Advanced Practice Midwife

## 2022-03-19 VITALS — BP 127/85 | HR 76 | Wt 210.0 lb

## 2022-03-19 DIAGNOSIS — Z3483 Encounter for supervision of other normal pregnancy, third trimester: Secondary | ICD-10-CM

## 2022-03-19 DIAGNOSIS — Z3A39 39 weeks gestation of pregnancy: Secondary | ICD-10-CM

## 2022-03-19 DIAGNOSIS — Z3480 Encounter for supervision of other normal pregnancy, unspecified trimester: Secondary | ICD-10-CM

## 2022-03-19 DIAGNOSIS — Z8759 Personal history of other complications of pregnancy, childbirth and the puerperium: Secondary | ICD-10-CM

## 2022-03-19 NOTE — Patient Instructions (Addendum)
Congratulations, you're on your way to having your baby!!!   You now have an induction scheduled.   If your induction is scheduled for midnight, you will arrive at 11:45pm on the date provided on the form form the clinical staff today. You will arrive at the [facility] at [facility] (Entrance C) and tell them you are there for your induction. The hospital will only call you to not come if they have NO beds available and need to postpone your admission time.    If your induction is scheduled for the daytime, you will see an appointment time in MyChart. Please DO NOT show up at this time, this is just a placeholder on the schedule. You will get a call when your room is ready and will have 2 hours to arrive. The hospital staff can call anytime starting at 5 am through the rest of the day.   You will also get a call from the pre-admission nurse to go over pre-admission screen about 2 days prior to your induction date.      Cervical Ripening (to get your cervix ready for labor) : May try one or all:  Red Raspberry Leaf capsules:  two 300mg  or 400mg  tablets with each meal, 2-3 times a day  Potential Side Effects Of Raspberry Leaf:  Most women do not experience any side effects from drinking raspberry leaf tea. However, nausea and loose stools are possible     Evening Primrose Oil capsules: may take 1 to 3 capsules daily. May also prick one to release the oil and insert it into your vagina at night.  Some of the potential side effects:  Upset stomach  Loose stools or diarrhea  Headaches  Nausea   6 Dates a day (may taste better if warmed in microwave until soft). Found where raisins are in the grocery store

## 2022-03-19 NOTE — Progress Notes (Signed)
   PRENATAL VISIT NOTE  Subjective:  Gabrielle Fernandez is a 26 y.o. G2P1001 at [redacted]w[redacted]d being seen today for ongoing prenatal care.  She is currently monitored for the following issues for this low-risk pregnancy and has History of gestational hypertension; Supervision of other normal pregnancy, antepartum; and Fetal membranous VSD on their problem list.  Patient reports no complaints.  Contractions: Irritability. Vag. Bleeding: None.  Movement: Present. Denies leaking of fluid.   The following portions of the patient's history were reviewed and updated as appropriate: allergies, current medications, past family history, past medical history, past social history, past surgical history and problem list. Problem list updated.  Objective:   Vitals:   03/19/22 0839  BP: 127/85  Pulse: 76  Weight: 210 lb (95.3 kg)    Fetal Status:     Movement: Present  Presentation: Vertex  General:  Alert, oriented and cooperative. Patient is in no acute distress.  Skin: Skin is warm and dry. No rash noted.   Cardiovascular: Normal heart rate noted  Respiratory: Normal respiratory effort, no problems with respiration noted  Abdomen: Soft, gravid, appropriate for gestational age.  Pain/Pressure: Present     Pelvic: Cervical exam performed Dilation: Fingertip Effacement (%): 60 Station: -2  Extremities: Normal range of motion.  Edema: Trace  Mental Status: Normal mood and affect. Normal behavior. Normal judgment and thought content.   Assessment and Plan:  Pregnancy: G2P1001 at [redacted]w[redacted]d  1. Supervision of other normal pregnancy, antepartum - Cervix not quite 1cm but almost. Mini-membrane sweep performed with patient's verbal consent. Reviewed cochrane review data on membrane sweeping at 39 wks and then at EDD. Reviewed risk of cramping, contractions, bleeding and ROM. Answered patient questions and she agreed to proceed with procedure.  --IOL scheduled for 06/21. Plan for Cytotec. Previous negative  experience with foley bulb and patient would like to see if she can avoid it this time   2. History of gestational hypertension - normotensive  3. [redacted] weeks gestation of pregnancy   Term labor symptoms and general obstetric precautions including but not limited to vaginal bleeding, contractions, leaking of fluid and fetal movement were reviewed in detail with the patient. Please refer to After Visit Summary for other counseling recommendations.  Return for IOL 06/21.  Low risk NIPS Boy(yes)/breast/declined GTT WNL Fetal VSD resolved, needs neonatal ECHO if murmur Needs postpartum pap please  Future Appointments  Date Time Provider Department Center  03/25/2022  6:45 AM [facility] SCHED ROOM [facility] None    Calvert Cantor, CNM

## 2022-03-20 ENCOUNTER — Telehealth (HOSPITAL_COMMUNITY): Payer: Self-pay | Admitting: *Deleted

## 2022-03-20 ENCOUNTER — Encounter (HOSPITAL_COMMUNITY): Payer: Self-pay | Admitting: *Deleted

## 2022-03-20 NOTE — Telephone Encounter (Signed)
Preadmission screen  

## 2022-03-22 ENCOUNTER — Other Ambulatory Visit: Payer: Self-pay | Admitting: Student

## 2022-03-25 ENCOUNTER — Inpatient Hospital Stay (HOSPITAL_COMMUNITY)
Admission: AD | Admit: 2022-03-25 | Discharge: 2022-03-28 | DRG: 807 | Disposition: A | Discharge status: Home / Self Care | Payer: Medicaid Other | Attending: Obstetrics and Gynecology | Admitting: Obstetrics and Gynecology

## 2022-03-25 ENCOUNTER — Encounter: Payer: Medicaid Other | Admitting: Family Medicine

## 2022-03-25 ENCOUNTER — Encounter (HOSPITAL_COMMUNITY): Payer: Self-pay | Admitting: Family Medicine

## 2022-03-25 ENCOUNTER — Other Ambulatory Visit: Payer: Self-pay

## 2022-03-25 ENCOUNTER — Inpatient Hospital Stay (HOSPITAL_COMMUNITY): Payer: Medicaid Other

## 2022-03-25 DIAGNOSIS — Z3A39 39 weeks gestation of pregnancy: Secondary | ICD-10-CM

## 2022-03-25 DIAGNOSIS — O99892 Other specified diseases and conditions complicating childbirth: Secondary | ICD-10-CM | POA: Diagnosis present

## 2022-03-25 DIAGNOSIS — O26893 Other specified pregnancy related conditions, third trimester: Secondary | ICD-10-CM | POA: Diagnosis present

## 2022-03-25 DIAGNOSIS — R030 Elevated blood-pressure reading, without diagnosis of hypertension: Secondary | ICD-10-CM | POA: Diagnosis present

## 2022-03-25 DIAGNOSIS — O358XX0 Maternal care for other (suspected) fetal abnormality and damage, not applicable or unspecified: Secondary | ICD-10-CM | POA: Diagnosis present

## 2022-03-25 DIAGNOSIS — Z3480 Encounter for supervision of other normal pregnancy, unspecified trimester: Secondary | ICD-10-CM

## 2022-03-25 DIAGNOSIS — Z8759 Personal history of other complications of pregnancy, childbirth and the puerperium: Secondary | ICD-10-CM

## 2022-03-25 DIAGNOSIS — N898 Other specified noninflammatory disorders of vagina: Secondary | ICD-10-CM | POA: Diagnosis not present

## 2022-03-25 DIAGNOSIS — O35BXX0 Maternal care for other (suspected) fetal abnormality and damage, fetal cardiac anomalies, not applicable or unspecified: Secondary | ICD-10-CM | POA: Diagnosis present

## 2022-03-25 DIAGNOSIS — Z3490 Encounter for supervision of normal pregnancy, unspecified, unspecified trimester: Secondary | ICD-10-CM | POA: Diagnosis present

## 2022-03-25 LAB — CBC
HCT: 35.8 % — ABNORMAL LOW (ref 36.0–46.0)
Hemoglobin: 12.3 g/dL (ref 12.0–15.0)
MCH: 30.8 pg (ref 26.0–34.0)
MCHC: 34.4 g/dL (ref 30.0–36.0)
MCV: 89.5 fL (ref 80.0–100.0)
Platelets: 143 10*3/uL — ABNORMAL LOW (ref 150–400)
RBC: 4 MIL/uL (ref 3.87–5.11)
RDW: 12.8 % (ref 11.5–15.5)
WBC: 8.6 10*3/uL (ref 4.0–10.5)
nRBC: 0 % (ref 0.0–0.2)

## 2022-03-25 LAB — TYPE AND SCREEN
ABO/RH(D): A POS
Antibody Screen: NEGATIVE

## 2022-03-25 LAB — RPR: RPR Ser Ql: NONREACTIVE

## 2022-03-25 MED ORDER — LACTATED RINGERS IV SOLN: 500.0000 mL | INTRAVENOUS | Status: DC | PRN

## 2022-03-25 MED ORDER — LACTATED RINGERS IV SOLN: INTRAVENOUS | Status: DC

## 2022-03-25 MED ORDER — MISOPROSTOL 25 MCG QUARTER TABLET
25.0000 ug | ORAL_TABLET | ORAL | Status: DC | PRN
  Administered 2022-03-25: 25 ug via VAGINAL
  Filled 2022-03-25: qty 1

## 2022-03-25 MED ORDER — OXYTOCIN-SODIUM CHLORIDE 30-0.9 UT/500ML-% IV SOLN: 2.5000 [IU]/h | INTRAVENOUS | Status: DC

## 2022-03-25 MED ORDER — ONDANSETRON HCL 4 MG/2ML IJ SOLN
4.0000 mg | Freq: Four times a day (QID) | INTRAMUSCULAR | Status: DC | PRN
  Administered 2022-03-26: 4 mg via INTRAVENOUS
  Filled 2022-03-25: qty 2

## 2022-03-25 MED ORDER — LIDOCAINE HCL (PF) 1 % IJ SOLN: 30.0000 mL | INTRAMUSCULAR | Status: DC | PRN

## 2022-03-25 MED ORDER — ACETAMINOPHEN 325 MG PO TABS: 650.0000 mg | ORAL_TABLET | ORAL | Status: DC | PRN

## 2022-03-25 MED ORDER — MISOPROSTOL 50MCG HALF TABLET
50.0000 ug | ORAL_TABLET | ORAL | Status: DC | PRN
  Administered 2022-03-25 – 2022-03-26 (×3): 50 ug via BUCCAL
  Filled 2022-03-25 (×3): qty 1

## 2022-03-25 MED ORDER — MISOPROSTOL 200 MCG PO TABS
ORAL_TABLET | ORAL | Status: AC
  Filled 2022-03-25: qty 1

## 2022-03-25 MED ORDER — TERBUTALINE SULFATE 1 MG/ML IJ SOLN: 0.2500 mg | Freq: Once | INTRAMUSCULAR | Status: DC | PRN

## 2022-03-25 MED ORDER — OXYTOCIN BOLUS FROM INFUSION
333.0000 mL | Freq: Once | INTRAVENOUS | Status: AC
  Administered 2022-03-26: 333 mL via INTRAVENOUS

## 2022-03-25 MED ORDER — MISOPROSTOL 50MCG HALF TABLET
50.0000 ug | ORAL_TABLET | ORAL | Status: DC | PRN
  Administered 2022-03-25 (×2): 50 ug via ORAL
  Filled 2022-03-25 (×2): qty 1

## 2022-03-25 MED ORDER — SOD CITRATE-CITRIC ACID 500-334 MG/5ML PO SOLN: 30.0000 mL | ORAL | Status: DC | PRN

## 2022-03-25 NOTE — H&P (Addendum)
OBSTETRIC ADMISSION HISTORY AND PHYSICAL  Gabrielle Fernandez is a 26 y.o. female G2P1001 with IUP at [redacted]w[redacted]d by LMP presenting for IOL. She reports +FMs, No LOF, no VB, no blurry vision, headaches or peripheral edema, and RUQ pain.  She plans on breast feeding. She is undecided for birth control. She received her prenatal [facility] at [facility]   Dating: By LMP --->  Estimated Date of Delivery: 03/26/22  Sono:    @[redacted]w[redacted]d , CWD, normal anatomy except VSD with possible spontaneous closure, cephalic presentation, 2903 g, 25% EFW   Prenatal History/Complications: history of gestational HTN  Past Medical History: Past Medical History:  Diagnosis Date   Acne    gestational hypertension     Past Surgical History: Past Surgical History:  Procedure Laterality Date   APPENDECTOMY     LAPAROSCOPIC APPENDECTOMY N/A 09/01/2016   Procedure: APPENDECTOMY LAPAROSCOPIC;  Surgeon: 09/03/2016, MD;  Location: [facility] ORS;  Service: General;  Laterality: N/A;    Obstetrical History: OB History     Gravida  2   Para  1   Term  1   Preterm      AB      Living  1      SAB      IAB      Ectopic      Multiple  0   Live Births  1           Social History Social History   Socioeconomic History   Marital status: Married    Spouse name: Henrene Dodge   Number of children: Not on file   Years of education: Not on file   Highest education level: Not on file  Occupational History   Occupation: exam room nurse    Comment: [employer]  Tobacco Use   Smoking status: Never   Smokeless tobacco: Never  Vaping Use   Vaping Use: Never used  Substance and Sexual Activity   Alcohol use: Not Currently   Drug use: No   Sexual activity: Yes    Birth control/protection: None  Other Topics Concern   Not on file  Social History Narrative   Not on file   Social Determinants of Health   Financial Resource Strain: Not on file  Food Insecurity: Not on file   Transportation Needs: Not on file  Physical Activity: Not on file  Stress: Not on file  Social Connections: Not on file    Family History: Family History  Problem Relation Age of Onset   Hypertension Mother    Hypertension Father     Allergies: No Known Allergies  Medications Prior to Admission  Medication Sig Dispense Refill Last Dose   aspirin EC 81 MG tablet Take 1 tablet (81 mg total) by mouth daily. 90 tablet 3    Prenatal Vit-Fe Fumarate-FA (MULTIVITAMIN-PRENATAL) 27-0.8 MG TABS tablet Take 1 tablet by mouth daily at 12 noon.        Review of Systems   All systems reviewed and negative except as stated in HPI  Blood pressure 125/87, pulse 72, temperature 97.9 F (36.6 C), temperature source Oral, resp. rate 18, height 5\' 7"  (1.702 m), weight 97.5 kg, last menstrual period 06/19/2021, unknown if currently breastfeeding. General appearance: alert, cooperative, appears stated age, and no distress Abdomen: soft, non-tender; bowel sounds normal Extremities: Homans sign is negative, no sign of DVT Fetal monitoringBaseline: 130 bpm, Variability: Good {> 6 bpm), Accelerations: Reactive, and Decelerations: Absent Uterine activityNone Dilation: Fingertip Effacement (%):  Thick Station: -3 Exam by:: Davis,RN   Prenatal labs: ABO, Rh: --/--/A POS (06/21 0750) Antibody: NEG (06/21 0750) Rubella: 5.21 (12/14 1001) RPR: NON REACTIVE (06/21 0750)  HBsAg: Negative (12/14 1001)  HIV: Non Reactive (04/06 0907)  GBS: Negative/-- (06/07 7124)   Prenatal Transfer Tool    Results for orders placed or performed during the hospital encounter of 03/25/22 (from the past 24 hour(s))  CBC   Collection Time: 03/25/22  7:50 AM  Result Value Ref Range   WBC 8.6 4.0 - 10.5 K/uL   RBC 4.00 3.87 - 5.11 MIL/uL   Hemoglobin 12.3 12.0 - 15.0 g/dL   HCT 58.0 (L) 99.8 - 33.8 %   MCV 89.5 80.0 - 100.0 fL   MCH 30.8 26.0 - 34.0 pg   MCHC 34.4 30.0 - 36.0 g/dL   RDW 25.0 53.9 - 76.7 %    Platelets 143 (L) 150 - 400 K/uL   nRBC 0.0 0.0 - 0.2 %  RPR   Collection Time: 03/25/22  7:50 AM  Result Value Ref Range   RPR Ser Ql NON REACTIVE NON REACTIVE  Type and screen   Collection Time: 03/25/22  7:50 AM  Result Value Ref Range   ABO/RH(D) A POS    Antibody Screen NEG    Sample Expiration      03/28/2022,2359 Performed at [facility], [address] [place], [place] [address]     Patient Active Problem List   Diagnosis Date Noted   Encounter for elective induction of labor 03/25/2022   Fetal membranous VSD 12/10/2021   Supervision of other normal pregnancy, antepartum 08/11/2021   History of gestational hypertension 08/23/2019    Assessment/Plan:  Gabrielle Fernandez is a 26 y.o. G2P1001 at [redacted]w[redacted]d here for IOL  #Labor: Admit for IOL #Pain: IV pain meds/epidural desired #FWB:  Category I #ID:  GBS- #MOF: Breast desired #MOC: Undecided #Circ:  Desired           Ryan Accomazzo  03/25/2022, 10:28 AM  Attestation of Supervision of Student:  I confirm that I have verified the information documented in the  resident 's note and that I have also personally reperformed the history, physical exam and all medical decision making activities.  I have verified that all services and findings are accurately documented in this student's note; and I agree with management and plan as outlined in the documentation. I have also made any necessary editorial changes.   Raelyn Mora, CNM Center for [employer], [facility] 03/25/2022 2:23 PM

## 2022-03-25 NOTE — Progress Notes (Signed)
Labor Progress Note Gabrielle Fernandez is a 26 y.o. G2P1001 at [redacted]w[redacted]d presented for eIOL at term.   S: Doing well.   O:  BP (!) 146/91 (BP Location: Left Arm)   Pulse 84   Temp 98.5 F (36.9 C) (Oral)   Resp 16   Ht 5\' 7"  (1.702 m)   Wt 97.5 kg   LMP 06/19/2021   BMI 33.67 kg/m  EFM: 135/mod/15x15/none  CVE: Dilation: 1.5 Effacement (%): thick Cervical Position: Posterior Station: -3 Presentation: Vertex Exam by:: Dr. [email or web address]   A&P: 26 y.o. G2P1001 [redacted]w[redacted]d  #Labor: Largely unchanged and still posterior. Patient is willing to try a FB placement again if the placement would be easier-- suspect with how posterior her cervix is now that that would not be the case on this check. Reconsider FB at next check pending exam. Buccal cytotec given, #4 (previously done oral).  #Pain: PRN  #FWB: Cat I  #GBS negative  #Elevated BP without diagnosis: last two BP's have been mild range. If recurrent, will get labs.   [redacted]w[redacted]d, DO 10:40 PM

## 2022-03-25 NOTE — Progress Notes (Signed)
Gabrielle Fernandez is a 26 y.o. G2P1001 at [redacted]w[redacted]d by LMP admitted for induction of labor due to Elective at term.  Subjective: No acute events. Patient is doing well. No complaints at this time.   Objective: BP 136/77   Pulse 78   Temp 98.5 F (36.9 C) (Oral)   Resp 16   Ht 5\' 7"  (1.702 m)   Wt 97.5 kg   LMP 06/19/2021   BMI 33.67 kg/m  No intake/output data recorded. No intake/output data recorded.  FHT:  FHR: 135 bpm, variability: moderate,  accelerations:  Present,  decelerations:  Absent UC:   irregular, every 3-8 minutes SVE:   Dilation: Fingertip Effacement (%): Thick Station: -3 Exam by:: Davis,RN  Labs: Lab Results  Component Value Date   WBC 8.6 03/25/2022   HGB 12.3 03/25/2022   HCT 35.8 (L) 03/25/2022   MCV 89.5 03/25/2022   PLT 143 (L) 03/25/2022    Assessment / Plan: Augmentation of labor, progressing well  Labor:  Progressing normally. Augmenting with cytotec. Attempted foley bulb although was unsuccessful. Will try Cytotec X 1 again and then recheck in 4-6 hours Preeclampsia:   n/a Fetal Wellbeing:  Category I Pain Control:  IV pain meds I/D:  n/a Anticipated MOD:  NSVD  03/27/2022 03/25/2022, 5:56 PM

## 2022-03-26 ENCOUNTER — Encounter (HOSPITAL_COMMUNITY): Payer: Self-pay | Admitting: Family Medicine

## 2022-03-26 ENCOUNTER — Inpatient Hospital Stay (HOSPITAL_COMMUNITY): Payer: Medicaid Other | Admitting: Anesthesiology

## 2022-03-26 DIAGNOSIS — Z3A39 39 weeks gestation of pregnancy: Secondary | ICD-10-CM

## 2022-03-26 DIAGNOSIS — N898 Other specified noninflammatory disorders of vagina: Secondary | ICD-10-CM | POA: Diagnosis not present

## 2022-03-26 LAB — CBC
HCT: 34.5 % — ABNORMAL LOW (ref 36.0–46.0)
HCT: 35.4 % — ABNORMAL LOW (ref 36.0–46.0)
Hemoglobin: 11.9 g/dL — ABNORMAL LOW (ref 12.0–15.0)
Hemoglobin: 11.7 g/dL — ABNORMAL LOW (ref 12.0–15.0)
MCH: 30.7 pg (ref 26.0–34.0)
MCH: 29.3 pg (ref 26.0–34.0)
MCHC: 34.5 g/dL (ref 30.0–36.0)
MCHC: 33.1 g/dL (ref 30.0–36.0)
MCV: 88.9 fL (ref 80.0–100.0)
MCV: 88.7 fL (ref 80.0–100.0)
Platelets: 143 10*3/uL — ABNORMAL LOW (ref 150–400)
Platelets: 140 10*3/uL — ABNORMAL LOW (ref 150–400)
RBC: 3.88 MIL/uL (ref 3.87–5.11)
RBC: 3.99 MIL/uL (ref 3.87–5.11)
RDW: 12.9 % (ref 11.5–15.5)
RDW: 12.9 % (ref 11.5–15.5)
WBC: 8.5 10*3/uL (ref 4.0–10.5)
WBC: 11.5 10*3/uL — ABNORMAL HIGH (ref 4.0–10.5)
nRBC: 0 % (ref 0.0–0.2)
nRBC: 0 % (ref 0.0–0.2)

## 2022-03-26 MED ORDER — DIBUCAINE (PERIANAL) 1 % EX OINT: 1.0000 | TOPICAL_OINTMENT | CUTANEOUS | Status: DC | PRN

## 2022-03-26 MED ORDER — PHENYLEPHRINE 80 MCG/ML (10ML) SYRINGE FOR IV PUSH (FOR BLOOD PRESSURE SUPPORT): 80.0000 ug | PREFILLED_SYRINGE | INTRAVENOUS | Status: DC | PRN

## 2022-03-26 MED ORDER — TERBUTALINE SULFATE 1 MG/ML IJ SOLN: 0.2500 mg | Freq: Once | INTRAMUSCULAR | Status: DC | PRN

## 2022-03-26 MED ORDER — DIPHENHYDRAMINE HCL 50 MG/ML IJ SOLN: 12.5000 mg | INTRAMUSCULAR | Status: DC | PRN

## 2022-03-26 MED ORDER — ACETAMINOPHEN 325 MG PO TABS
650.0000 mg | ORAL_TABLET | ORAL | Status: DC | PRN
  Administered 2022-03-27: 650 mg via ORAL
  Filled 2022-03-26: qty 2

## 2022-03-26 MED ORDER — LACTATED RINGERS IV SOLN
500.0000 mL | Freq: Once | INTRAVENOUS | Status: AC
  Administered 2022-03-26: 500 mL via INTRAVENOUS

## 2022-03-26 MED ORDER — ONDANSETRON HCL 4 MG PO TABS: 4.0000 mg | ORAL_TABLET | ORAL | Status: DC | PRN

## 2022-03-26 MED ORDER — PRENATAL MULTIVITAMIN CH
1.0000 | ORAL_TABLET | Freq: Every day | ORAL | Status: DC
  Administered 2022-03-27 – 2022-03-28 (×2): 1 via ORAL
  Filled 2022-03-26 (×2): qty 1

## 2022-03-26 MED ORDER — ZOLPIDEM TARTRATE 5 MG PO TABS: 5.0000 mg | ORAL_TABLET | Freq: Every evening | ORAL | Status: DC | PRN

## 2022-03-26 MED ORDER — LIDOCAINE HCL (PF) 1 % IJ SOLN
INTRAMUSCULAR | Status: DC | PRN
  Administered 2022-03-26: 8 mL via EPIDURAL

## 2022-03-26 MED ORDER — COCONUT OIL OIL: 1.0000 | TOPICAL_OIL | Status: DC | PRN

## 2022-03-26 MED ORDER — DIPHENHYDRAMINE HCL 25 MG PO CAPS: 25.0000 mg | ORAL_CAPSULE | Freq: Four times a day (QID) | ORAL | Status: DC | PRN

## 2022-03-26 MED ORDER — IBUPROFEN 600 MG PO TABS
600.0000 mg | ORAL_TABLET | Freq: Four times a day (QID) | ORAL | Status: DC
  Administered 2022-03-26 – 2022-03-28 (×7): 600 mg via ORAL
  Filled 2022-03-26 (×7): qty 1

## 2022-03-26 MED ORDER — ONDANSETRON HCL 4 MG/2ML IJ SOLN: 4.0000 mg | INTRAMUSCULAR | Status: DC | PRN

## 2022-03-26 MED ORDER — SENNOSIDES-DOCUSATE SODIUM 8.6-50 MG PO TABS
2.0000 | ORAL_TABLET | Freq: Every day | ORAL | Status: DC
  Administered 2022-03-27 – 2022-03-28 (×2): 2 via ORAL
  Filled 2022-03-26 (×2): qty 2

## 2022-03-26 MED ORDER — WITCH HAZEL-GLYCERIN EX PADS: 1.0000 | MEDICATED_PAD | CUTANEOUS | Status: DC | PRN

## 2022-03-26 MED ORDER — TETANUS-DIPHTH-ACELL PERTUSSIS 5-2.5-18.5 LF-MCG/0.5 IM SUSY: 0.5000 mL | PREFILLED_SYRINGE | Freq: Once | INTRAMUSCULAR | Status: DC

## 2022-03-26 MED ORDER — LACTATED RINGERS AMNIOINFUSION: INTRAVENOUS | Status: DC

## 2022-03-26 MED ORDER — SIMETHICONE 80 MG PO CHEW: 80.0000 mg | CHEWABLE_TABLET | ORAL | Status: DC | PRN

## 2022-03-26 MED ORDER — FENTANYL-BUPIVACAINE-NACL 0.5-0.125-0.9 MG/250ML-% EP SOLN
12.0000 mL/h | EPIDURAL | Status: DC | PRN
  Administered 2022-03-26: 12 mL/h via EPIDURAL
  Filled 2022-03-26: qty 250

## 2022-03-26 MED ORDER — EPHEDRINE 5 MG/ML INJ: 10.0000 mg | INTRAVENOUS | Status: DC | PRN

## 2022-03-26 MED ORDER — BENZOCAINE-MENTHOL 20-0.5 % EX AERO: 1.0000 | INHALATION_SPRAY | CUTANEOUS | Status: DC | PRN

## 2022-03-26 MED ORDER — OXYTOCIN-SODIUM CHLORIDE 30-0.9 UT/500ML-% IV SOLN
1.0000 m[IU]/min | INTRAVENOUS | Status: DC
  Administered 2022-03-26: 2 m[IU]/min via INTRAVENOUS
  Filled 2022-03-26: qty 500

## 2022-03-26 NOTE — Progress Notes (Addendum)
Gabrielle Fernandez is a 26 y.o. G2P1001 at [redacted]w[redacted]d by LMP admitted for induction of labor due to Elective at term.  Subjective: No acute events. Patient is doing well. No complaints at this time. Rechecking patient now  Objective: BP 139/77   Pulse 82   Temp 98.1 F (36.7 C) (Oral)   Resp 17   Ht 5\' 7"  (1.702 m)   Wt 97.5 kg   LMP 06/19/2021   BMI 33.67 kg/m  No intake/output data recorded. No intake/output data recorded.  FHT:  FHR: 135 bpm, variability: moderate,  accelerations:  Present,  decelerations:  Absent UC:   irregular, every 3-8 minutes SVE:   Dilation: 3.5 Effacement (%): Thick Station: -3 Exam by:: Chelsey Kimberley, CNM  Labs: Lab Results  Component Value Date   WBC 8.5 03/26/2022   HGB 11.9 (L) 03/26/2022   HCT 34.5 (L) 03/26/2022   MCV 88.9 03/26/2022   PLT 143 (L) 03/26/2022    Assessment / Plan: Augmentation of labor, progressing well  Labor:  Progressing normally. Augmenting with cytotecX6 (last dose 0636). Foley bulb fell out at 0815. Patient would like to get epidural and then AROM + Pit. Discusses risks and benefits. All questions answered. Cephalic position confirmed via U/S   Preeclampsia:   n/a Fetal Wellbeing:  Category I Pain Control:  IV pain meds I/D:  n/a Anticipated MOD:  NSVD  03/28/2022 03/26/2022, 10:42 AM   Attestation of Supervision of Student:  I confirm that I have verified the information documented in the  resident  student's note and that I have also personally reperformed the history, physical exam and all medical decision making activities.  I have verified that all services and findings are accurately documented in this student's note; and I agree with management and plan as outlined in the documentation. I have also made any necessary editorial changes.   03/28/2022, CNM [facility], [facility] 03/26/2022 11:10 AM

## 2022-03-26 NOTE — Discharge Summary (Shared)
Postpartum Discharge Summary  Date of Service updated***   Patient Name: Gabrielle Fernandez DOB: 1996-01-28 MRN: [id number]  Date of admission: 03/25/2022 Delivery date:03/26/2022  Delivering provider: Genia Del  Date of discharge: 03/26/2022  Admitting diagnosis: Encounter for elective induction of labor [Z34.90] Intrauterine pregnancy: [redacted]w[redacted]d    Secondary diagnosis:  Principal Problem:   Vaginal delivery Active Problems:   History of gestational hypertension   Supervision of other normal pregnancy, antepartum   Fetal membranous VSD   Encounter for elective induction of labor   Vaginal cyst  Additional problems: ***    Discharge diagnosis: Term Pregnancy Delivered                                              Post partum procedures: {Postpartum procedures:23558} Augmentation: AROM, Pitocin, Cytotec, and IP Foley Complications: None  Hospital course: Induction of Labor With Vaginal Delivery   26y.o. yo G2P1001 at 44w0das admitted to the hospital 03/25/2022 for induction of labor.  Indication for induction: Elective.  Patient had an uncomplicated labor course and vaginal delivery.  Membrane Rupture Time/Date: 12:05 PM ,03/26/2022   Delivery Method:Vaginal, Spontaneous  Episiotomy: None  Lacerations:  1st degree;Perineal  Details of delivery can be found in separate delivery note.  Patient had a routine postpartum course.  She is eating, drinking, voiding, and ambulating without issue.  Her pain and bleeding are controlled.  She is ***feeding well.  Patient is discharged home 03/26/22.  Newborn Data: Birth date:03/26/2022  Birth time:7:12 PM  Gender:Female  Living status:Living  Apgars:8 ,9  Weight:   Magnesium Sulfate received: No BMZ received: No Rhophylac: N/A MMR: N/A - Immune  T-DaP: Given prenatally Flu: No Transfusion: No  Physical exam  Vitals:   03/26/22 1606 03/26/22 1631 03/26/22 1731 03/26/22 1801  BP:  123/72 115/62 123/78  Pulse:  71 75  (!) 270  Resp:      Temp: 98.1 F (36.7 C)     TempSrc: Axillary     SpO2:      Weight:      Height:       General: {Exam; general:21111117} Lochia: {Desc; appropriate/inappropriate:30686::"appropriate"} Uterine Fundus: {Desc; firm/soft:30687} DVT Evaluation: {Exam; dvt:2111122}  Labs: Lab Results  Component Value Date   WBC 8.5 03/26/2022   HGB 11.9 (L) 03/26/2022   HCT 34.5 (L) 03/26/2022   MCV 88.9 03/26/2022   PLT 143 (L) 03/26/2022      Latest Ref Rng & Units 08/25/2019    3:45 AM  CMP  Glucose 70 - 99 mg/dL 84   BUN 6 - 20 mg/dL 5   Creatinine 0.44 - 1.00 mg/dL 0.58   Sodium 135 - 145 mmol/L 140   Potassium 3.5 - 5.1 mmol/L 3.6   Chloride 98 - 111 mmol/L 106   CO2 22 - 32 mmol/L 20   Calcium 8.9 - 10.3 mg/dL 9.2   Total Protein 6.5 - 8.1 g/dL 6.0   Total Bilirubin 0.3 - 1.2 mg/dL 1.2   Alkaline Phos 38 - 126 U/L 101   AST 15 - 41 U/L 22   ALT 0 - 44 U/L 17    Edinburgh Score:    08/25/2019   10:34 AM  Edinburgh Postnatal Depression Scale Screening Tool  I have been able to laugh and see the funny side of things. 0  I have looked forward with enjoyment to things. 0  I have blamed myself unnecessarily when things went wrong. 0  I have been anxious or worried for no good reason. 0  I have felt scared or panicky for no good reason. 0  Things have been getting on top of me. 0  I have been so unhappy that I have had difficulty sleeping. 0  I have felt sad or miserable. 0  I have been so unhappy that I have been crying. 0  The thought of harming myself has occurred to me. 0  Edinburgh Postnatal Depression Scale Total 0    After visit meds:  Allergies as of 03/26/2022   No Known Allergies   Med Rec must be completed prior to using this [facility]***       Discharge home in stable condition Infant Feeding: {Baby feeding:23562} Infant Disposition: {CHL IP OB HOME WITH ECXFQH:22575} Discharge instruction: per After Visit Summary and Postpartum  booklet. Activity: Advance as tolerated. Pelvic rest for 6 weeks.  Diet: routine diet Future Appointments:No future appointments. Follow up Visit: Message sent to [facility] by Dr. Gwenlyn Perking on 03/26/22.   Please schedule this patient for a In person postpartum visit in 6 weeks with the following provider: Any provider. Additional Postpartum F/U:  Reassess vaginal cyst at [facility] visit  Low risk pregnancy complicated by: N/A Delivery mode:  Vaginal, Spontaneous  Anticipated Birth Control:  Unsure  03/26/2022 Genia Del, MD

## 2022-03-26 NOTE — Progress Notes (Signed)
Labor Progress Note Gabrielle Fernandez is a 26 y.o. G2P1001 at [redacted]w[redacted]d presented for eIOL.   S: Doing well, feeling more cramping with this round of cytotec.   O:  BP 135/86 (BP Location: Left Arm)   Pulse 68   Temp 98.5 F (36.9 C) (Oral)   Resp 16   Ht 5\' 7"  (1.702 m)   Wt 97.5 kg   LMP 06/19/2021   BMI 33.67 kg/m  EFM: 120/mod/15x15/none  CVE: Dilation: 1.5 Effacement (%): 50 Cervical Position: Posterior Station: -3 Presentation: Vertex Exam by:: Dr. [email or web address]   A&P: 26 y.o. G2P1001 [redacted]w[redacted]d  #Labor: Some small progression since last check. After verbal consent, placed FB w/ 60 cc. Patient tolerated well. Given additional buccal cytotec.  #Pain: PRN  #FWB: Cat I  #GBS negative  [redacted]w[redacted]d, DO 2:03 AM

## 2022-03-26 NOTE — Lactation Note (Signed)
This note was copied from a baby's chart. Lactation Consultation Note  Patient Name: Gabrielle Fernandez QUIQN'V Date: 03/26/2022   Age:25 hours  LC went in to assist with breastfeeding. Infant already latched for 15 min as per RN and doing well. LC to follow up with Mom on the floor.   Maternal Data    Feeding    LATCH Score Latch: Grasps breast easily, tongue down, lips flanged, rhythmical sucking.  Audible Swallowing: Spontaneous and intermittent  Type of Nipple: Everted at rest and after stimulation  Comfort (Breast/Nipple): Soft / non-tender  Hold (Positioning): Assistance needed to correctly position infant at breast and maintain latch.  LATCH Score: 9   Lactation Tools Discussed/Used    Interventions    Discharge    Consult Status      Gabrielle Treptow  Fernandez 03/26/2022, 8:00 PM

## 2022-03-26 NOTE — Progress Notes (Signed)
KHAMRYN CALDERONE is a 26 y.o. G2P1001 at [redacted]w[redacted]d by LMP admitted for induction of labor due to Elective at term.  Subjective: No acute events. Patient is doing well. No complaints at this time. Foley bulb fell out this AM  Objective: BP 139/77   Pulse 82   Temp 98.1 F (36.7 C) (Oral)   Resp 17   Ht 5\' 7"  (1.702 m)   Wt 97.5 kg   LMP 06/19/2021   BMI 33.67 kg/m  No intake/output data recorded. No intake/output data recorded.  FHT:  FHR: 135 bpm, variability: moderate,  accelerations:  Present,  decelerations:  Absent UC:   irregular, every 3-8 minutes SVE:   Dilation: 1.5 Effacement (%): 50 Station: -3 Exam by:: Dr. [email or web address]  Labs: Lab Results  Component Value Date   WBC 8.5 03/26/2022   HGB 11.9 (L) 03/26/2022   HCT 34.5 (L) 03/26/2022   MCV 88.9 03/26/2022   PLT 143 (L) 03/26/2022    Assessment / Plan: Augmentation of labor, progressing well  Labor:  Progressing normally. Augmenting with cytotecX6. Foley bulb fell out at 0815. Last cytotec given at 0636 so will plan to start pitocin at approximately 1030.    Preeclampsia:   n/a Fetal Wellbeing:  Category I Pain Control:  IV pain meds I/D:  n/a Anticipated MOD:  NSVD  Ryan Accomazzo 03/26/2022, 10:14 AM

## 2022-03-26 NOTE — Anesthesia Preprocedure Evaluation (Signed)
Anesthesia Evaluation  Patient identified by MRN, date of birth, ID band Patient awake    Reviewed: Allergy & Precautions, H&P , NPO status , Patient's Chart, lab work & pertinent test results  Airway Mallampati: I   Neck ROM: full    Dental   Pulmonary neg pulmonary ROS,    breath sounds clear to auscultation       Cardiovascular hypertension (ghtn in the past),  Rhythm:regular Rate:Normal     Neuro/Psych negative neurological ROS  negative psych ROS   GI/Hepatic negative GI ROS, Neg liver ROS,   Endo/Other  negative endocrine ROS  Renal/GU negative Renal ROS  negative genitourinary   Musculoskeletal negative musculoskeletal ROS (+)   Abdominal   Peds negative pediatric ROS (+)  Hematology  (+) Blood dyscrasia, anemia ,   Anesthesia Other Findings   Reproductive/Obstetrics negative OB ROS (+) Pregnancy                             Anesthesia Physical Anesthesia Plan  ASA: 2  Anesthesia Plan: Epidural   Post-op Pain Management:    Induction:   PONV Risk Score and Plan: 2 and Treatment may vary due to age or medical condition  Airway Management Planned: Natural Airway  Additional Equipment: None  Intra-op Plan:   Post-operative Plan:   Informed Consent: I have reviewed the patients History and Physical, chart, labs and discussed the procedure including the risks, benefits and alternatives for the proposed anesthesia with the patient or authorized representative who has indicated his/her understanding and acceptance.       Plan Discussed with: Anesthesiologist  Anesthesia Plan Comments:         Anesthesia Quick Evaluation

## 2022-03-26 NOTE — Anesthesia Procedure Notes (Signed)
Epidural Patient location during procedure: OB Start time: 03/26/2022 11:15 AM End time: 03/26/2022 11:25 AM  Staffing Anesthesiologist: Mellody Dance, MD Performed: anesthesiologist   Preanesthetic Checklist Completed: patient identified, IV checked, site marked, risks and benefits discussed, monitors and equipment checked, pre-op evaluation and timeout performed  Epidural Patient position: sitting Prep: DuraPrep Patient monitoring: heart rate, cardiac monitor, continuous pulse ox and blood pressure Approach: midline Location: L2-L3 Injection technique: LOR saline  Needle:  Needle type: Tuohy  Needle gauge: 17 G Needle length: 9 cm Needle insertion depth: 6 cm Catheter type: closed end flexible Catheter size: 20 Guage Catheter at skin depth: 11 cm Test dose: negative and Other  Assessment Events: blood not aspirated, injection not painful, no injection resistance and negative IV test  Additional Notes Informed consent obtained prior to proceeding including risk of failure, 1% risk of PDPH, risk of minor discomfort and bruising.  Discussed rare but serious complications including epidural abscess, permanent nerve injury, epidural hematoma.  Discussed alternatives to epidural analgesia and patient desires to proceed.  Timeout performed pre-procedure verifying patient name, procedure, and platelet count.  Patient tolerated procedure well.

## 2022-03-26 NOTE — Progress Notes (Addendum)
CORY RAMA is a 26 y.o. G2P1001 at [redacted]w[redacted]d by LMP admitted for induction of labor due to Elective at term.  Subjective: No acute events. Patient is doing well. On 10 of pitocin. Discussed IUPC as contractions have been difficult to place  Objective: BP 114/82   Pulse 77   Temp 98.1 F (36.7 C) (Axillary)   Resp 17   Ht 5\' 7"  (1.702 m)   Wt 97.5 kg   LMP 06/19/2021   SpO2 99%   BMI 33.67 kg/m  No intake/output data recorded. Total I/O In: -  Out: 1150 [Urine:1150]  FHT:  FHR: 135 bpm, variability: moderate,  accelerations:  Present,  decelerations:  Absent  UC:   regular, every 3-5 minutes although difficult to tell prior to IUPC placement  SVE:   Dilation: 4 Effacement (%): 50 Station: -1 Exam by:: [email or web address], Rn  Labs: Lab Results  Component Value Date   WBC 8.5 03/26/2022   HGB 11.9 (L) 03/26/2022   HCT 34.5 (L) 03/26/2022   MCV 88.9 03/26/2022   PLT 143 (L) 03/26/2022    Assessment / Plan: Augmentation of labor, progressing well  Labor:  Progressing appropriately. Due to inconsistency of contraction tracing. IUPC placed. Patient tolerated well without complications. Patient on 10 pitocin.    Preeclampsia:   n/a Fetal Wellbeing:  Category I Pain Control:  Epidural and IV pain meds I/D:  n/a Anticipated MOD:  NSVD  03/28/2022 03/26/2022, 4:30 PM   Attestation of Supervision of Student:  I confirm that I have verified the information documented in the  resident  student's note and that I have also personally reperformed the history, physical exam and all medical decision making activities.  I have verified that all services and findings are accurately documented in this student's note; and I agree with management and plan as outlined in the documentation. I have also made any necessary editorial changes.   03/28/2022, CNM [facility], [facility] 03/26/2022 5:36 PM

## 2022-03-26 NOTE — Progress Notes (Signed)
   Gabrielle Fernandez is a 26 y.o. G2P1001 at [redacted]w[redacted]d  admitted for eIOL  Subjective: Doing well, now has epidural and does not want any more cytotec, wants AROM and pitocin  Objective: Vitals:   03/26/22 1131 03/26/22 1136 03/26/22 1151 03/26/22 1201  BP: 124/84 127/82 134/85 128/79  Pulse: 87 (!) 106 77 82  Resp:      Temp:      TempSrc:      SpO2: 98% 99% 99% 99%  Weight:      Height:       No intake/output data recorded.  FHT:  FHR: 120 bpm, variability: moderate,  accelerations:  Present,  decelerations:  Absent UC:   irregular, every 4 minutes SVE:   Dilation: 3.5 Effacement (%): Thick Station: -3 Exam by:: Daniela Hernan, CNM   Labs: Lab Results  Component Value Date   WBC 8.5 03/26/2022   HGB 11.9 (L) 03/26/2022   HCT 34.5 (L) 03/26/2022   MCV 88.9 03/26/2022   PLT 143 (L) 03/26/2022    Assessment / Plan: Early labor ; AROM clear fluid at 1200 and will start pitocin 2 x 2  Labor:  early labor Fetal Wellbeing:  Category I Pain Control:  Epidural Anticipated MOD:  NSVD  Charlesetta Garibaldi Khyran Riera 03/26/2022, 12:11 PM

## 2022-03-26 NOTE — Plan of Care (Signed)
  Problem: Education: Goal: Knowledge of General Education information will improve Description: Including pain rating scale, medication(s)/side effects and non-pharmacologic comfort measures Outcome: Completed/Met

## 2022-03-27 MED ORDER — FUROSEMIDE 20 MG PO TABS
20.0000 mg | ORAL_TABLET | Freq: Every day | ORAL | Status: DC
Start: 2022-03-27 — End: 2022-03-28
  Administered 2022-03-27 – 2022-03-28 (×2): 20 mg via ORAL
  Filled 2022-03-27 (×2): qty 1

## 2022-03-27 MED ORDER — NIFEDIPINE ER OSMOTIC RELEASE 30 MG PO TB24
30.0000 mg | ORAL_TABLET | Freq: Every day | ORAL | Status: DC
  Administered 2022-03-27 – 2022-03-28 (×2): 30 mg via ORAL
  Filled 2022-03-27 (×2): qty 1

## 2022-03-27 NOTE — Progress Notes (Signed)
Spoke to Dr. Mathis Fare regarding blood pressure. This RN just gave patient her first dose of procardia and lasix per order. Dr. Mathis Fare declined the need to recheck blood pressure in one hour; however, ordered blood pressure checks every 4 hours and Dr. Mathis Fare instructed RN to call if blood pressures began an increasing trend. Gabrielle Fernandez, [facility] [place]

## 2022-03-27 NOTE — Anesthesia Postprocedure Evaluation (Signed)
Anesthesia Post Note  Patient: Gabrielle Fernandez  Procedure(s) Performed: AN AD HOC LABOR EPIDURAL     Patient location during evaluation: Mother Baby Anesthesia Type: Epidural Level of consciousness: awake and alert Pain management: pain level controlled Vital Signs Assessment: post-procedure vital signs reviewed and stable Respiratory status: spontaneous breathing, nonlabored ventilation and respiratory function stable Cardiovascular status: stable Postop Assessment: no headache, no backache and epidural receding Anesthetic complications: no   No notable events documented.  Last Vitals:  Vitals:   03/27/22 0230 03/27/22 0500  BP: 128/78 131/85  Pulse: 68 81  Resp: 18 18  Temp: 36.9 C 37 C  SpO2: 97% 99%    Last Pain:  Vitals:   03/27/22 0500  TempSrc: Axillary  PainSc:    Pain Goal: Patients Stated Pain Goal: 0 (03/26/22 0630)                 Melainie Krinsky

## 2022-03-28 ENCOUNTER — Ambulatory Visit: Payer: Self-pay

## 2022-03-28 MED ORDER — FUROSEMIDE 20 MG PO TABS: 20.0000 mg | ORAL_TABLET | Freq: Every day | ORAL | 0 refills | Status: DC

## 2022-03-28 MED ORDER — NIFEDIPINE ER 30 MG PO TB24: 30.0000 mg | ORAL_TABLET | Freq: Every day | ORAL | 1 refills | Status: DC

## 2022-03-28 MED ORDER — IBUPROFEN 600 MG PO TABS: 600.0000 mg | ORAL_TABLET | Freq: Four times a day (QID) | ORAL | 0 refills | Status: AC

## 2022-04-01 ENCOUNTER — Encounter: Payer: Medicaid Other | Admitting: Obstetrics & Gynecology

## 2022-04-02 ENCOUNTER — Ambulatory Visit: Payer: Medicaid Other

## 2022-04-03 ENCOUNTER — Telehealth (HOSPITAL_COMMUNITY): Payer: Self-pay | Admitting: *Deleted

## 2022-04-03 NOTE — Telephone Encounter (Addendum)
Mom reports feeling good. No concerns about herself at this time. Mom reports feeling good emotionally [facility] epds score=1) Mom reports baby is doing well. Feeding, peeing, and pooping without difficulty. Safe sleep reviewed. Mom reports no concerns about baby at present.  Duffy Rhody, RN 04-03-2022 at 9:16am

## 2022-04-19 ENCOUNTER — Other Ambulatory Visit: Payer: Self-pay

## 2022-05-06 ENCOUNTER — Other Ambulatory Visit: Payer: Self-pay

## 2022-05-12 ENCOUNTER — Ambulatory Visit: Payer: Medicaid Other | Admitting: Obstetrics & Gynecology

## 2022-05-27 ENCOUNTER — Other Ambulatory Visit (HOSPITAL_COMMUNITY)
Admission: RE | Admit: 2022-05-27 | Discharge: 2022-05-27 | Disposition: A | Discharge status: Home / Self Care | Payer: Medicaid Other | Source: Ambulatory Visit | Attending: Obstetrics & Gynecology | Admitting: Obstetrics & Gynecology

## 2022-05-27 ENCOUNTER — Ambulatory Visit (INDEPENDENT_AMBULATORY_CARE_PROVIDER_SITE_OTHER): Payer: Medicaid Other | Admitting: Family Medicine

## 2022-05-27 VITALS — BP 117/80 | HR 68 | Wt 179.0 lb

## 2022-05-27 DIAGNOSIS — Z124 Encounter for screening for malignant neoplasm of cervix: Secondary | ICD-10-CM | POA: Insufficient documentation

## 2022-05-27 DIAGNOSIS — Z30430 Encounter for insertion of intrauterine contraceptive device: Secondary | ICD-10-CM | POA: Diagnosis not present

## 2022-05-27 LAB — POCT URINE PREGNANCY: Preg Test, Ur: NEGATIVE

## 2022-05-27 MED ORDER — PARAGARD INTRAUTERINE COPPER IU IUD
1.0000 | INTRAUTERINE_SYSTEM | Freq: Once | INTRAUTERINE | Status: AC
  Administered 2022-05-27: 1 via INTRAUTERINE

## 2022-05-27 NOTE — Progress Notes (Signed)
Post Partum Visit Note  Gabrielle Fernandez is a 25 y.o. G31P2002 female who presents for a postpartum visit. She is 9 weeks postpartum following a normal spontaneous vaginal delivery.  I have fully reviewed the prenatal and intrapartum course. The delivery was at 40.0 gestational weeks.  Anesthesia: epidural. Postpartum course has been uncomplicated. Baby is doing well. Baby is feeding by breast. Bleeding  has her cycles back . Bowel function is normal. Bladder function is normal. Patient is sexually active. Contraception method, would like to discuss paragard IUD. Has been using condoms for now.Postpartum depression screening: negative.   The pregnancy intention screening data noted above was reviewed. Potential methods of contraception were discussed. The patient elected to proceed with No data recorded.   Edinburgh Postnatal Depression Scale - 05/27/22 1448       Edinburgh Postnatal Depression Scale:  In the Past 7 Days   I have been able to laugh and see the funny side of things. 0    I have looked forward with enjoyment to things. 0    I have blamed myself unnecessarily when things went wrong. 0    I have been anxious or worried for no good reason. 0    I have felt scared or panicky for no good reason. 0    Things have been getting on top of me. 0    I have been so unhappy that I have had difficulty sleeping. 0    I have felt sad or miserable. 0    I have been so unhappy that I have been crying. 0    The thought of harming myself has occurred to me. 0    Edinburgh Postnatal Depression Scale Total 0             Health Maintenance Due  Topic Date Due   COVID-19 Vaccine (1) Never done   HPV VACCINES (1 - 2-dose series) Never done   PAP-Cervical Cytology Screening  02/27/2022   PAP SMEAR-Modifier  02/27/2022   INFLUENZA VACCINE  05/05/2022    The following portions of the patient's history were reviewed and updated as appropriate: allergies, current medications, past  family history, past medical history, past social history, past surgical history, and problem list.  Review of Systems Pertinent items noted in HPI and remainder of comprehensive ROS otherwise negative.  Objective:  BP 117/80   Pulse 68   Wt 179 lb (81.2 kg)   Breastfeeding Yes   BMI 28.04 kg/m    General:  alert, cooperative, and appears stated age  Lungs: Normal effort  Heart:  regular rate and rhythm  Abdomen: soft, non-tender; bowel sounds normal; no masses,  no organomegaly   GU exam:  normal no evidence of vaginal cyst, more c/w previous scarring from birth, normal appearing cervix       Procedure: Patient identified, informed consent performed, signed copy in chart, time out was performed.  Urine pregnancy test negative. Speculum placed in the vagina.  Cervix visualized. Cleaned with Betadine x 2.  Grasped anteriourly with a single tooth tenaculum. Paragard IUD placed per manufacturer's recommendations.  Strings trimmed to 3 cm.  Patient is asked to check IUD strings periodically and follow up in 4-6 weeks for IUD check.   Assessment:   Normal postpartum exam.   Plan:   Essential components of care per ACOG recommendations:  1.  Mood and well being: Patient with negative depression screening today. Reviewed local resources for support.  - Patient tobacco use?  No.   - hx of drug use? No.    2. Infant care and feeding:  -Patient currently breastmilk feeding? Yes. Reviewed importance of draining breast regularly to support lactation.  -Social determinants of health (SDOH) reviewed in EPIC. No concerns  3. Sexuality, contraception and birth spacing - Patient does not want a pregnancy in the next year.  Desired family size is 2 children.  - Reviewed reproductive life planning. Reviewed contraceptive methods based on pt preferences and effectiveness.  Patient desired IUD or IUS today.   - Discussed birth spacing of 18 months  4. Sleep and fatigue -Encouraged  family/partner/community support of 4 hrs of uninterrupted sleep to help with mood and fatigue  5. Physical Recovery  - Discussed patients delivery and complications. She describes her labor as good. - Patient had a Vaginal, no problems at delivery. Patient had a 1st degree laceration. Perineal healing reviewed. Patient expressed understanding - Patient has urinary incontinence? No. - Patient is safe to resume physical and sexual activity  6.  Health Maintenance - HM due items addressed Yes - Last pap smear  Diagnosis  Date Value Ref Range Status  02/28/2019   Final   NEGATIVE FOR INTRAEPITHELIAL LESIONS OR MALIGNANCY.   Pap smear done at today's visit.  -Breast Cancer screening indicated? No.   7. Chronic Disease/Pregnancy Condition follow up: None  - PCP follow up  Reva Bores, MD Center for [facility], [facility]

## 2022-06-04 LAB — CYTOLOGY - PAP
Adequacy: ABSENT
Comment: NEGATIVE
Diagnosis: UNDETERMINED — AB
High risk HPV: NEGATIVE

## 2022-06-25 ENCOUNTER — Ambulatory Visit: Payer: Medicaid Other | Admitting: Advanced Practice Midwife

## 2022-10-07 ENCOUNTER — Ambulatory Visit (INDEPENDENT_AMBULATORY_CARE_PROVIDER_SITE_OTHER): Payer: Medicaid Other | Admitting: Family Medicine

## 2022-10-07 VITALS — BP 120/82 | HR 63 | Wt 168.0 lb

## 2022-10-07 DIAGNOSIS — Z30431 Encounter for routine checking of intrauterine contraceptive device: Secondary | ICD-10-CM | POA: Diagnosis not present

## 2022-10-07 NOTE — Progress Notes (Signed)
RGYN patient here for IUD Check.  Pt states she is not able to feel strings.  Notes Mild cramping had pink mucous like discharge last week.  Has not a "real period yet"

## 2022-10-07 NOTE — Progress Notes (Signed)
   Subjective:    Patient ID: Gabrielle Fernandez is a 27 y.o. female presenting with Contraception (IUD Check)  on 10/07/2022  HPI: Here for IUD check. Notes feels something hard and plastic and does not feel normal strings.  Review of Systems  Constitutional:  Negative for chills and fever.  Respiratory:  Negative for shortness of breath.   Cardiovascular:  Negative for chest pain.  Gastrointestinal:  Negative for abdominal pain, nausea and vomiting.  Genitourinary:  Negative for dysuria.  Skin:  Negative for rash.      Objective:    There were no vitals taken for this visit. Physical Exam Exam conducted with a chaperone present.  Constitutional:      General: She is not in acute distress.    Appearance: She is well-developed.  HENT:     Head: Normocephalic and atraumatic.  Eyes:     General: No scleral icterus. Cardiovascular:     Rate and Rhythm: Normal rate.  Pulmonary:     Effort: Pulmonary effort is normal.  Abdominal:     Palpations: Abdomen is soft.  Genitourinary:    Comments: BUS normal, vagina is pink and rugated, cervix is parous without lesion, uterus is small and anteverted, no adnexal mass or tenderness. IUD strings noted in place. No tip of IUD seen or felt on exam.  Musculoskeletal:     Cervical back: Neck supple.  Skin:    General: Skin is warm and dry.  Neurological:     Mental Status: She is alert and oriented to person, place, and time.         Assessment & Plan:  IUD check up - Paragard appears to be in correct place  Return if symptoms worsen or fail to improve.  Donnamae Jude, MD 10/07/2022 9:05 AM

## 2022-11-30 IMAGING — US US MFM OB DETAIL+14 WK
1 series · 13 of 28 positions shown · non-contrast
Comparison: none

[Series 1: us mfm ob detail+14 wk · 107 acquisitions, 13 frames shown]
[im 4/107]
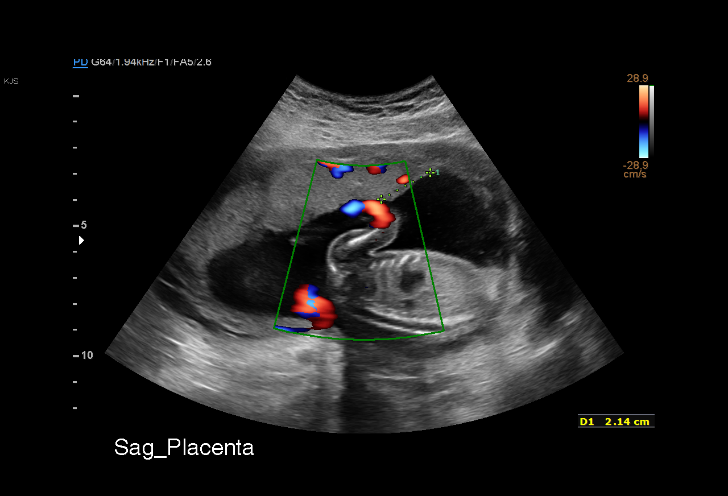
[im 12/107]
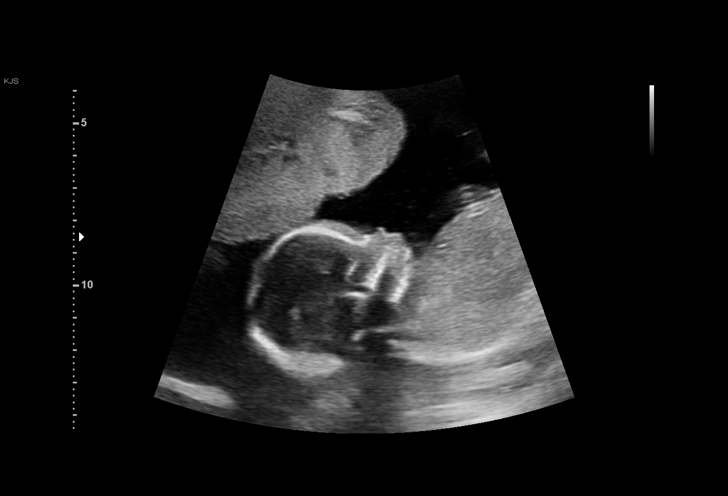
[im 20/107]
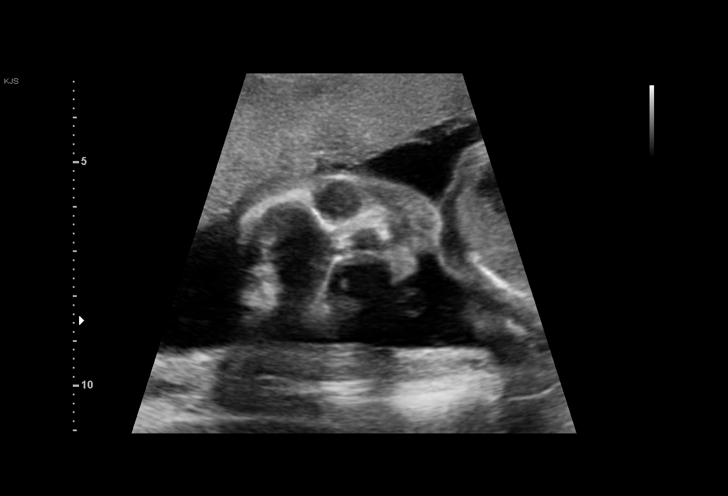
[im 28/107]
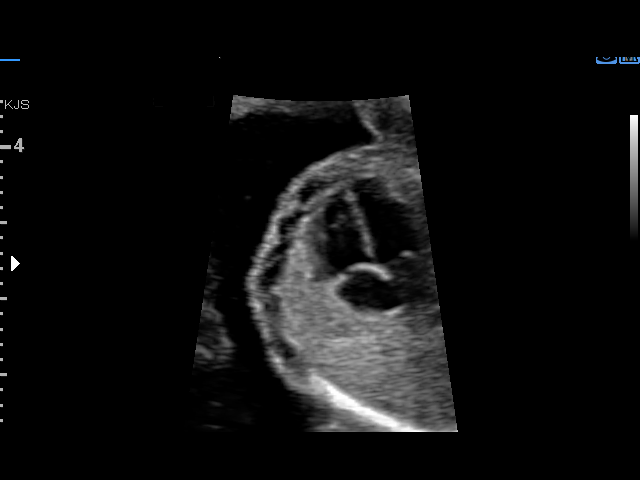
[im 36/107]
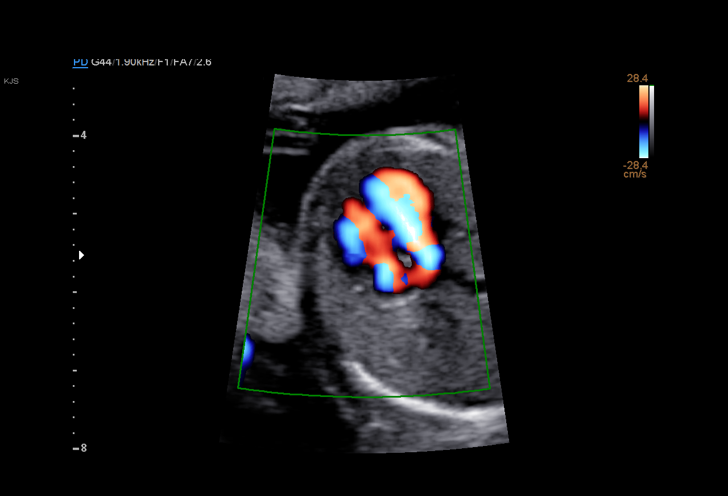
[im 44/107]
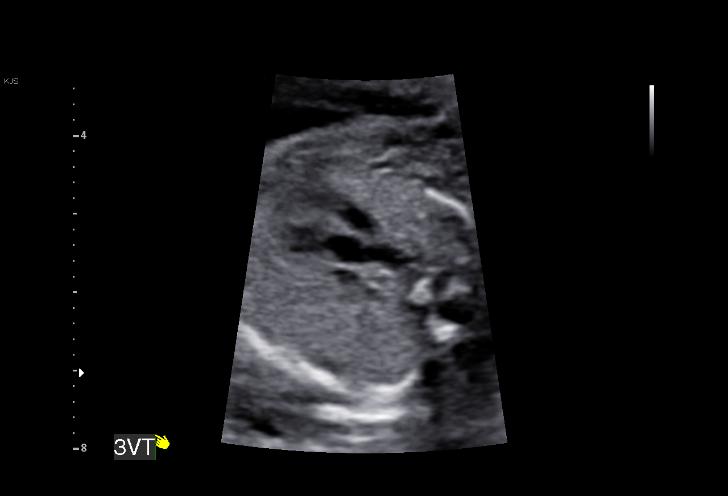
[im 55/107]
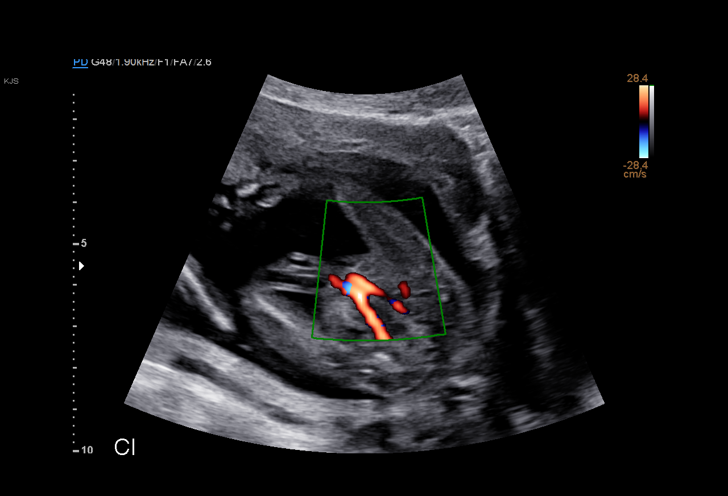
[im 63/107]
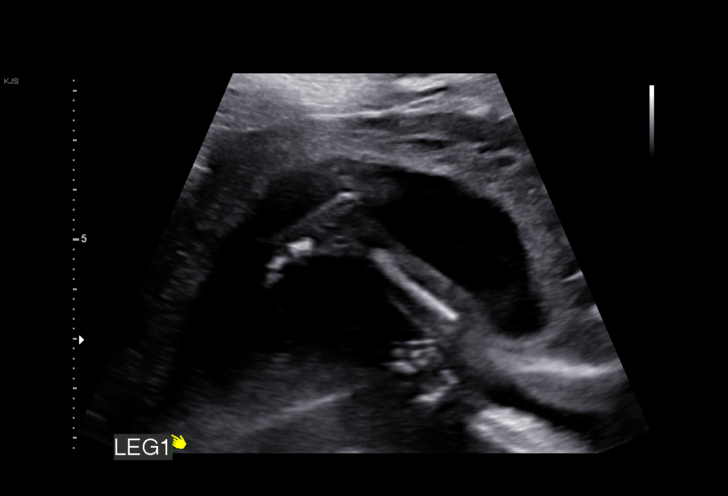
[im 71/107]
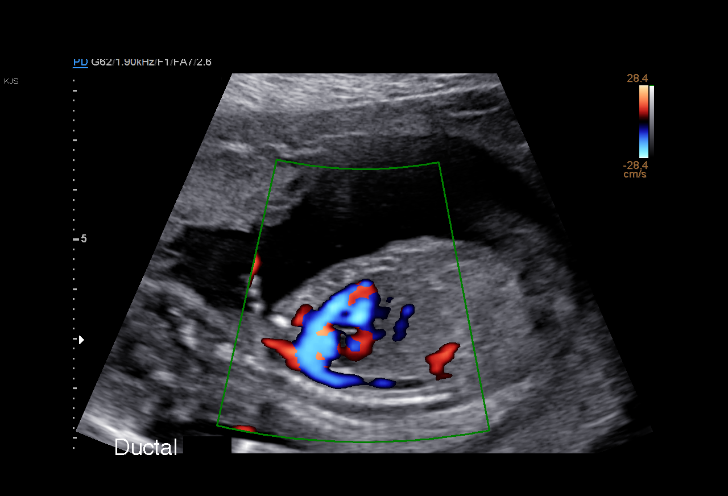
[im 79/107]
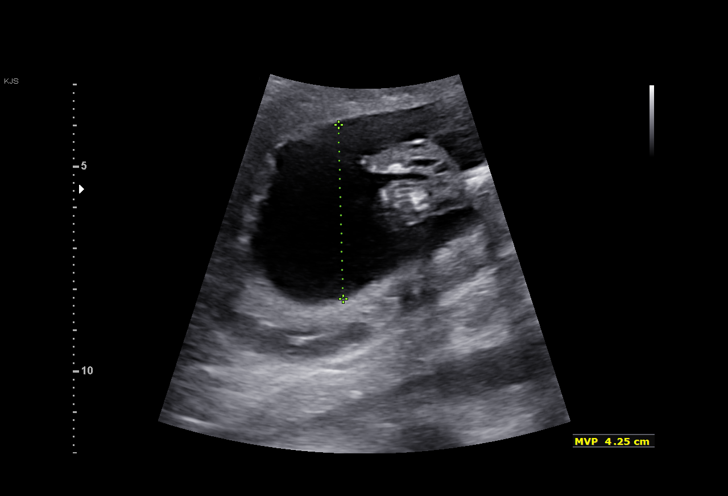
[im 87/107]
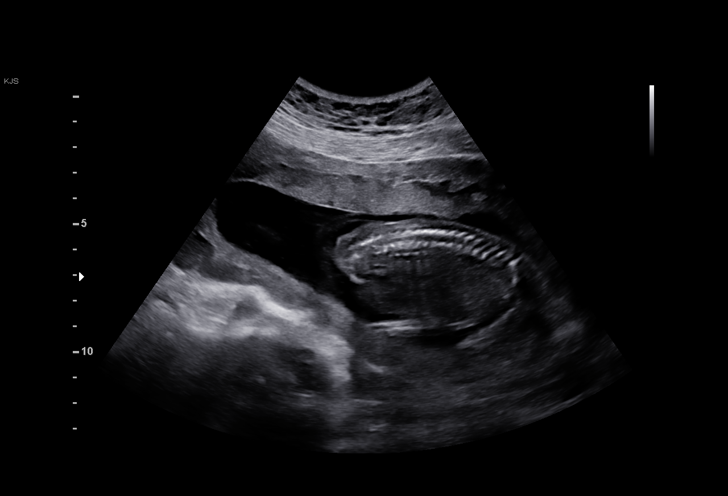
[im 95/107]
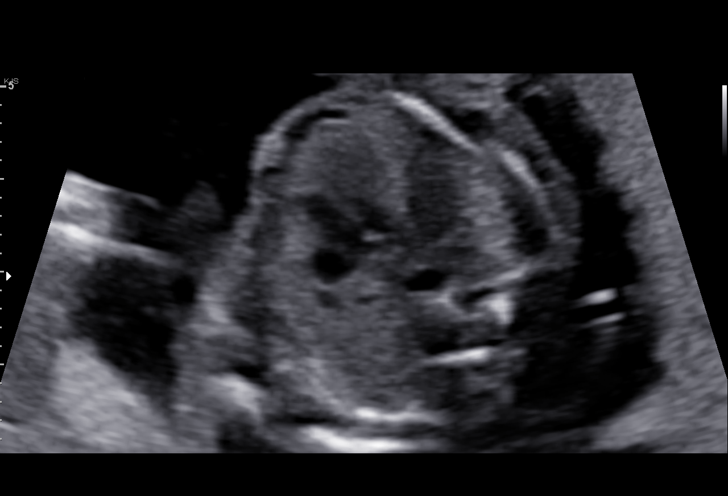
[im 103/107]
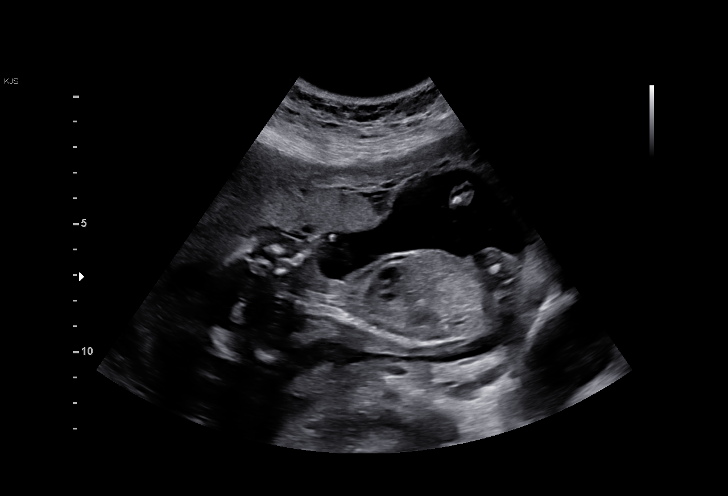

[13 of 28 positions shown; findings below may reference images not displayed]

Indications

 Poor obstetric history: gestational HTN
 19 weeks gestation of pregnancy
 Encounter for antenatal screening for
 malformations
 LR NIPS, Neg Horizon
Fetal Evaluation

 Num Of Fetuses:         1
 Fetal Heart Rate(bpm):  133
 Cardiac Activity:       Observed
 Presentation:           Breech
 Placenta:               Anterior
 P. Cord Insertion:      Visualized, central

 Amniotic Fluid
 AFI FV:      Within normal limits

                             Largest Pocket(cm)

Biometry

 BPD:      40.2  mm     G. Age:  18w 1d         18  %    CI:        69.44   %    70 - 86
                                                         FL/HC:      17.3   %    16.1 -
 HC:       154   mm     G. Age:  18w 3d         16  %    HC/AC:      1.16        1.09 -
 AC:      132.6  mm     G. Age:  18w 5d         37  %    FL/BPD:     66.2   %
 FL:       26.6  mm     G. Age:  18w 1d         15  %    FL/AC:      20.1   %    20 - 24
 CER:      17.9  mm     G. Age:  17w 6d      < 2.3  %
 NFT:       2.0  mm
 CM:        4.8  mm
 Est. FW:     240  gm      0 lb 8 oz     17  %
OB History

 Gravidity:    2         Term:   1        Prem:   0        SAB:   0
 TOP:          0       Ectopic:  0        Living: 1
Gestational Age

 LMP:           19w 0d        Date:  06/19/21                 EDD:   03/26/22
 U/S Today:     18w 3d                                        EDD:   03/30/22
 Best:          19w 0d     Det. By:  LMP  (06/19/21)          EDD:   03/26/22
Anatomy

 Cranium:               Appears normal         Aortic Arch:            Not well visualized
 Cavum:                 Appears normal         Ductal Arch:            Appears normal
 Ventricles:            Appears normal         Diaphragm:              Appears normal
 Choroid Plexus:        Appears normal         Stomach:                Appears normal, left
                                                                       sided
 Cerebellum:            Appears normal         Abdomen:                Appears normal
 Posterior Fossa:       Appears normal         Abdominal Wall:         Appears nml (cord
                                                                       insert, abd wall)
 Nuchal Fold:           Appears normal         Cord Vessels:           Appears normal (3
                                                                       vessel cord)
 Face:                  Appears normal         Kidneys:                Not well visualized
                        (orbits and profile)
 Lips:                  Appears normal         Bladder:                Appears normal
 Thoracic:              Appears normal         Spine:                  Not well visualized
 Heart:                 Not well visualized    Upper Extremities:      Appears normal
 RVOT:                  Appears normal         Lower Extremities:      Appears normal
 LVOT:                  Appears normal

 Other:  VC, 3VV and 3VTV visualized. Fetus appears to be a male.
Cervix Uterus Adnexa

 Cervix
 Length:           4.29  cm.
 Normal appearance by transabdominal scan.

 Right Ovary
 Not visualized.

 Left Ovary
 Visualized.
Comments

 This patient was seen for a detailed fetal anatomy scan.  She
 reports a history of gestational hypertension in her last
 pregnancy.
 She denies any other significant past medical history and
 denies any problems in her current pregnancy.
 She had a cell free DNA test earlier in her pregnancy which
 indicated a low risk for trisomy 21, 18, and 13. A male fetus is
 predicted.
 She was informed that the fetal growth and amniotic fluid
 level were appropriate for her gestational age.
 In certain views of the fetal heart, a possible VSD was noted
 on today's exam.  This finding was not noted in all views.  The
 patient was advised regarding the possibility of a VSD in the
 fetus.  She was reassured that many small VSDs may close
 spontaneously after birth.
 Due to the possible VSD noted today, she was referred to
 [facility] for a fetal echocardiogram.
 The patient was informed that anomalies may be missed due
 to technical limitations. If the fetus is in a suboptimal position
 or maternal habitus is increased, visualization of the fetus in
 the maternal uterus may be impaired.
 The association between VSD's and fetal chromosomal
 abnormalities was discussed.  She was offered and declined
 an amniocentesis today for definitive diagnosis of fetal
 aneuploidy.  She is comfortable with her negative cell free
 DNA test.
 A follow-up exam was scheduled in 4 weeks.

## 2023-02-01 IMAGING — US US MFM OB FOLLOW-UP
1 series · 14 of 28 positions shown · non-contrast
Comparison: none

[Series 1: us mfm ob follow-up · 34 acquisitions, 14 frames shown]
[im 2/34]
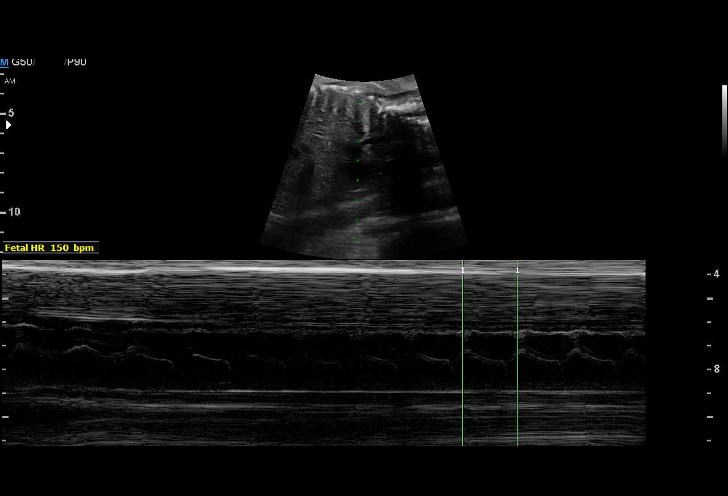
[im 4/34]
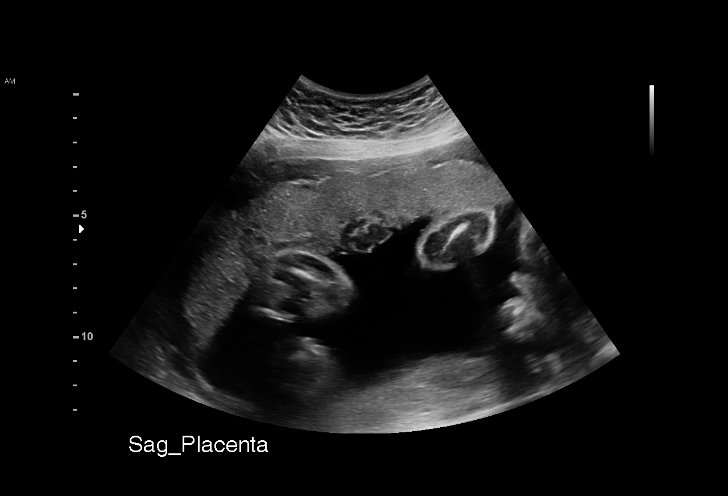
[im 7/34]
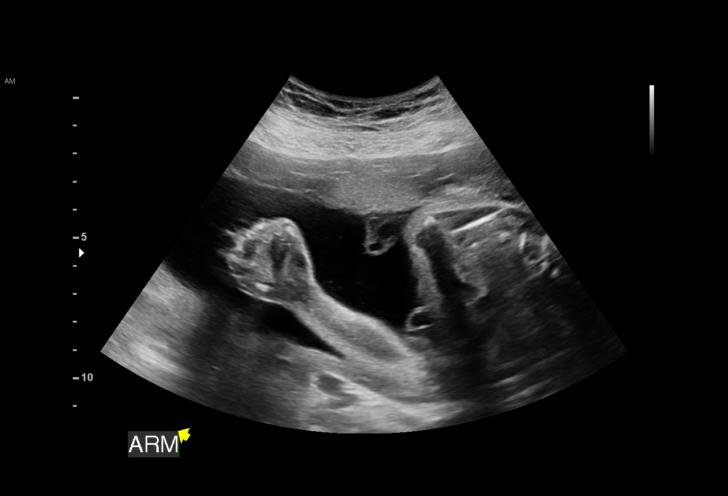
[im 9/34]
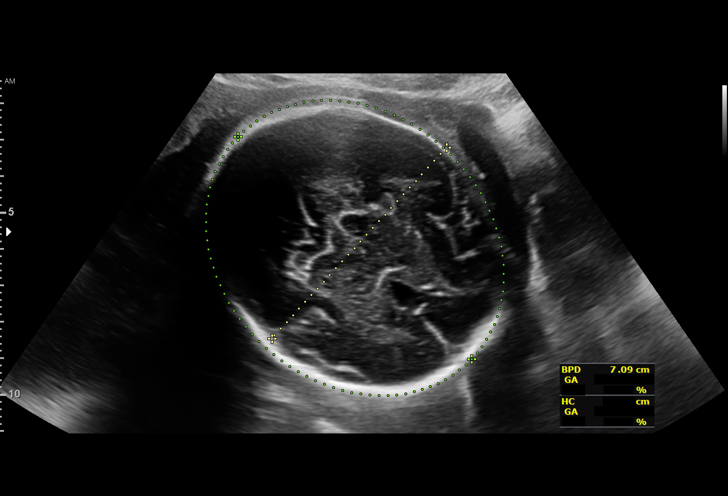
[im 12/34]
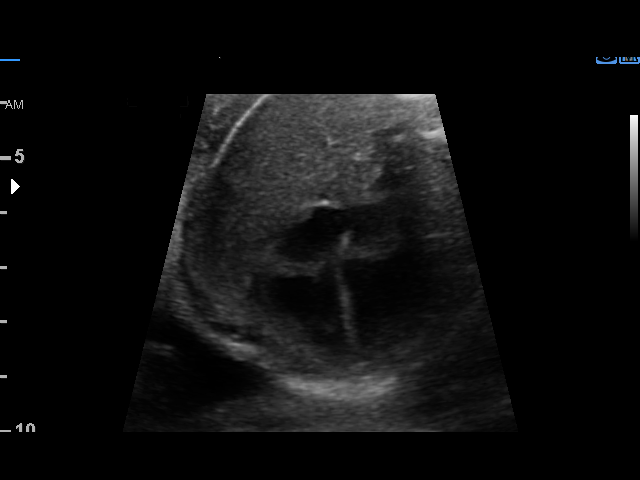
[im 14/34]
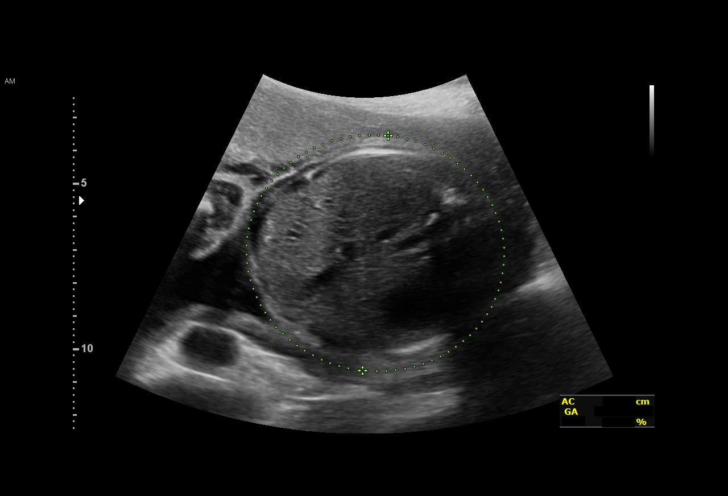
[im 16/34]
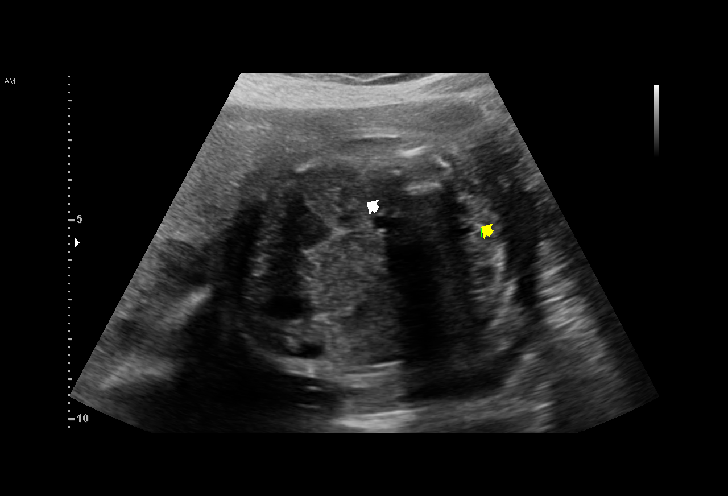
[im 19/34]
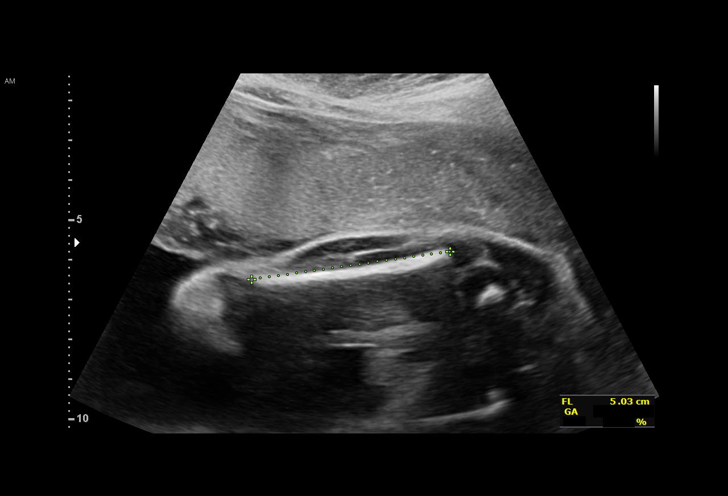
[im 21/34]
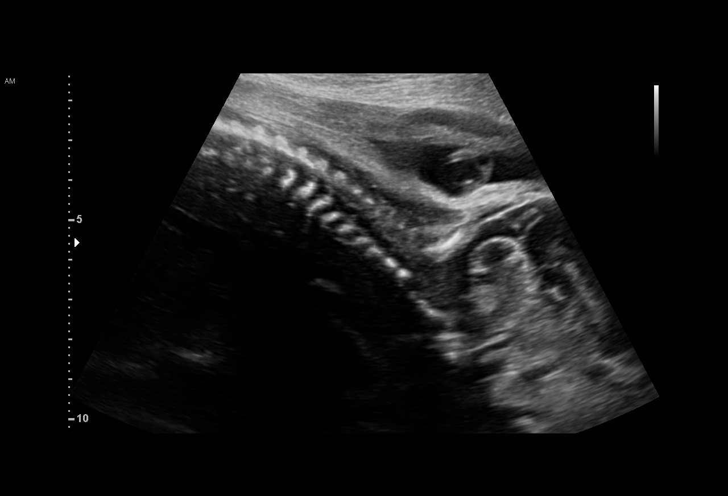
[im 24/34]
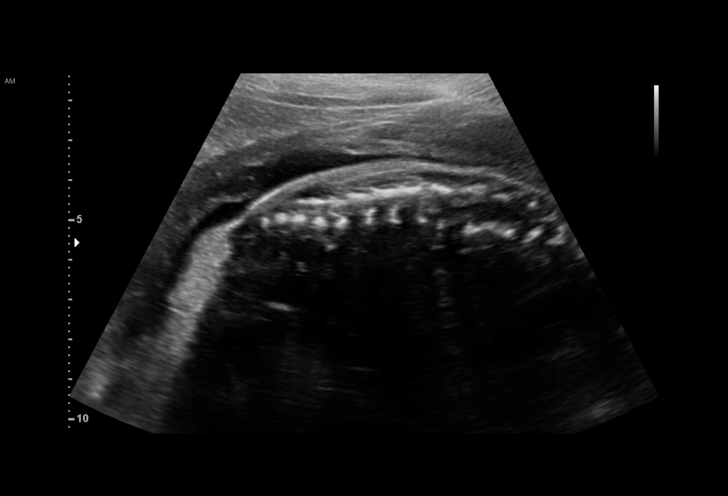
[im 26/34]
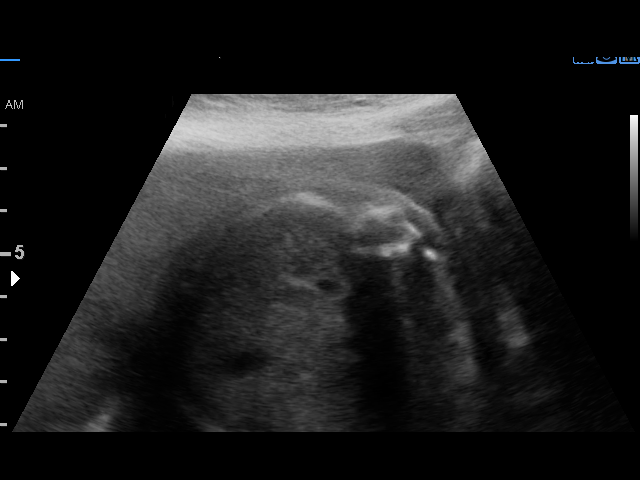
[im 29/34]
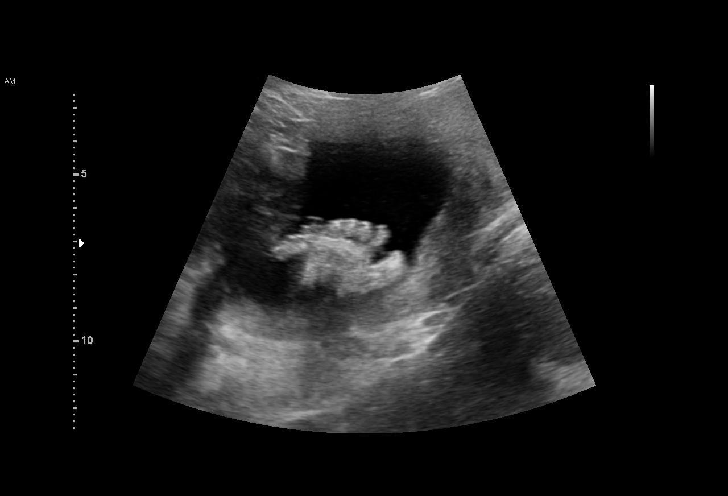
[im 31/34]
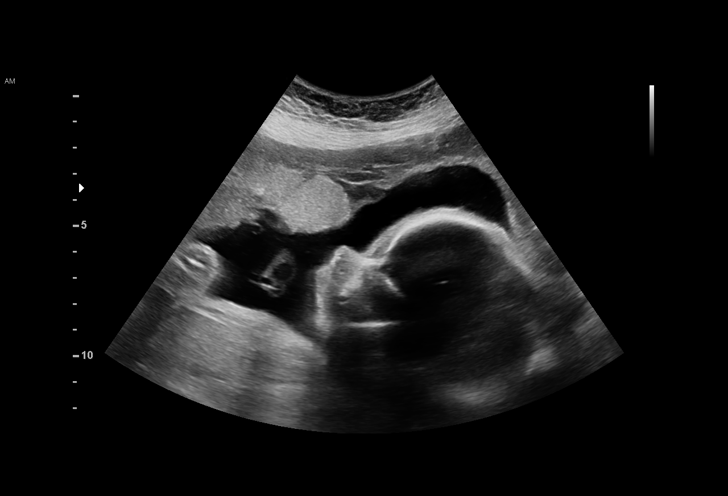
[im 34/34]
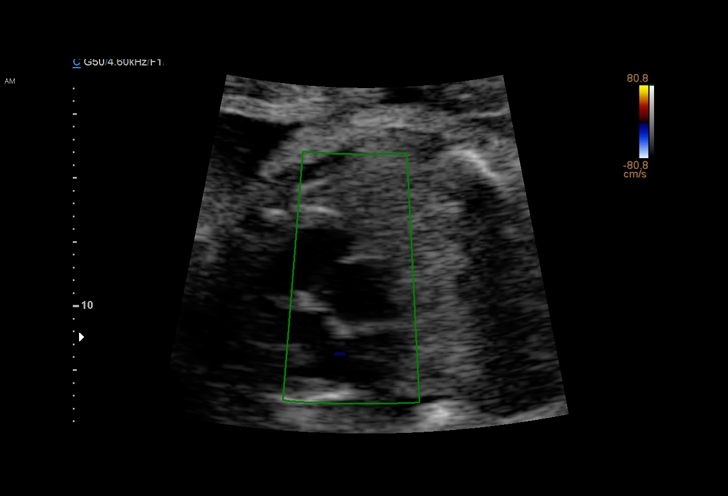

[14 of 28 positions shown; findings below may reference images not displayed]

CENDEJAS

Indications

 28 weeks gestation of pregnancy
 Poor obstetric history: gestational HTN
 LR NIPS, Neg Horizon
 Abnormal fetal ultrasound- VSD
Fetal Evaluation

 Num Of Fetuses:         1
 Fetal Heart Rate(bpm):  150
 Cardiac Activity:       Observed
 Presentation:           Cephalic
 Placenta:               Anterior
 P. Cord Insertion:      Previously Visualized

 Amniotic Fluid
 AFI FV:      Within normal limits

 AFI Sum(cm)     %Tile       Largest Pocket(cm)
 17.16           64

 RUQ(cm)       RLQ(cm)       LUQ(cm)        LLQ(cm)

Biometry

 BPD:        71  mm     G. Age:  28w 4d         55  %    CI:        76.49   %    70 - 86
                                                         FL/HC:      19.7   %    18.8 -
 HC:      257.2  mm     G. Age:  28w 0d         19  %    HC/AC:      1.10        1.05 -
 AC:      233.7  mm     G. Age:  27w 5d         33  %    FL/BPD:     71.3   %    71 - 87
 FL:       50.6  mm     G. Age:  27w 1d         14  %    FL/AC:      21.7   %    20 - 24
 LV:        3.3  mm
 Est. FW:    7237  gm      2 lb 7 oz     23  %
OB History

 Gravidity:    2         Term:   1        Prem:   0        SAB:   0
 TOP:          0       Ectopic:  0        Living: 1
Gestational Age

 LMP:           28w 0d        Date:  06/19/21                  EDD:   03/26/22
 U/S Today:     27w 6d                                        EDD:   03/27/22
 Best:          28w 0d     Det. By:  LMP  (06/19/21)          EDD:   03/26/22
Anatomy

 Cranium:               Appears normal         LVOT:                   Previously seen
 Cavum:                 Previously seen        Aortic Arch:            Previously seen
 Ventricles:            Appears normal         Ductal Arch:            Previously seen
 Choroid Plexus:        Previously seen        Diaphragm:              Previously seen
 Cerebellum:            Previously seen        Stomach:                Appears normal, left
                                                                       sided
 Posterior Fossa:       Previously seen        Abdomen:                Previously seen
 Nuchal Fold:           Previously seen        Abdominal Wall:         Previously seen
 Face:                  Orbits and profile     Cord Vessels:           Previously seen
                        previously seen
 Lips:                  Previously seen        Kidneys:                Appear normal
 Palate:                Previously seen        Bladder:                Appears normal
 Thoracic:              Previously seen        Spine:                  Appears normal
 Heart:                 VSD,                   Upper Extremities:      Previously seen
                        perimembranous
 RVOT:                  Previously seen        Lower Extremities:      Previously seen

 Other:  VC, 3VV and 3VTV previously visualized. Male gender previously
         seen. Technically difficult due to fetal position.
Cervix Uterus Adnexa

 Cervix
 Not visualized (advanced GA >54wks)

 Right Ovary
 Visualized.

 Left Ovary
 Visualized.
Impression

 Perimembranous VSD confirmed on fetal echocardiography.
 Fetal growth is appropriate for gestational age.  Amniotic fluid
 is normal and good fetal activity is seen.  Fetal spine appears
 normal.

 I reassured the patient of the findings.

 She had gestational hypertension and a previous pregnancy.
 Blood pressure today at her office is 112/72 mmHg.
Recommendations

 -An appointment was made for her to return in 6 weeks for
 fetal growth assessment.
                Pomerantz, Adrain

## 2023-04-10 IMAGING — US US MFM OB FOLLOW-UP
1 series · 13 of 28 positions shown · non-contrast
Comparison: none

[Series 1: us mfm ob follow-up · 51 acquisitions, 13 frames shown]
[im 2/51]
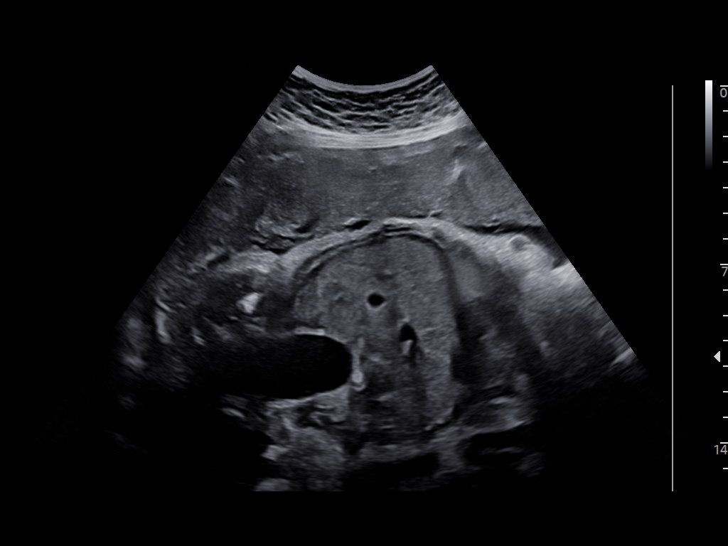
[im 6/51]
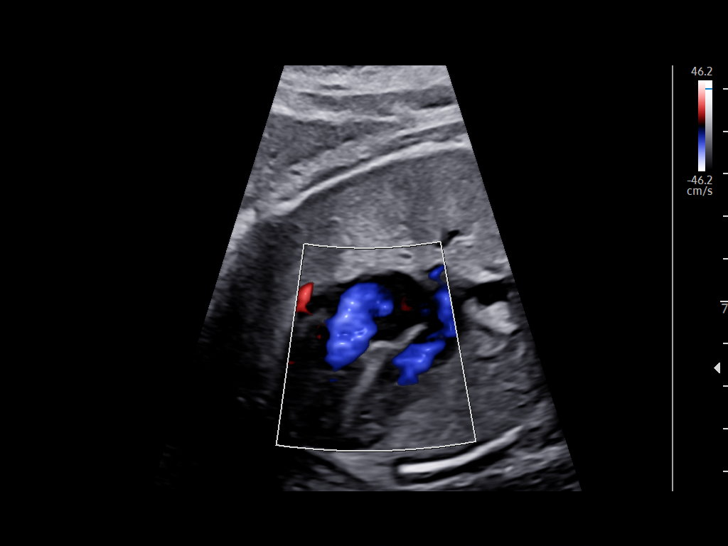
[im 10/51]
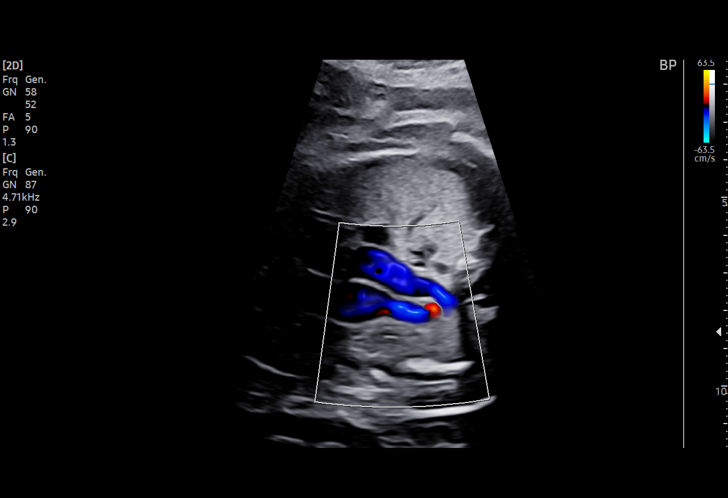
[im 13/51]
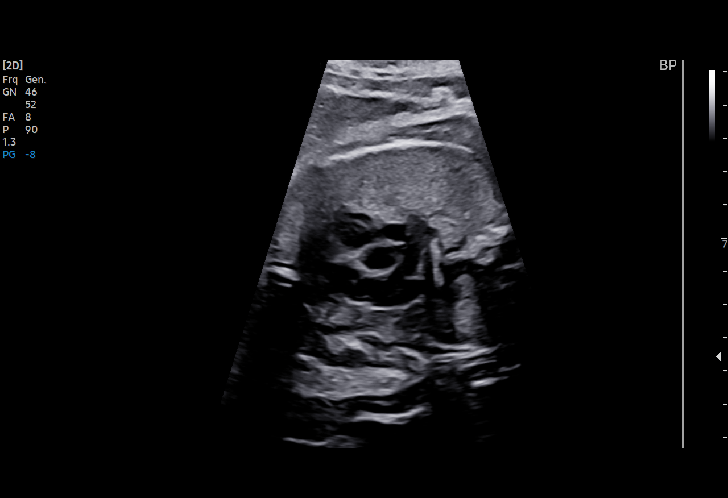
[im 17/51]
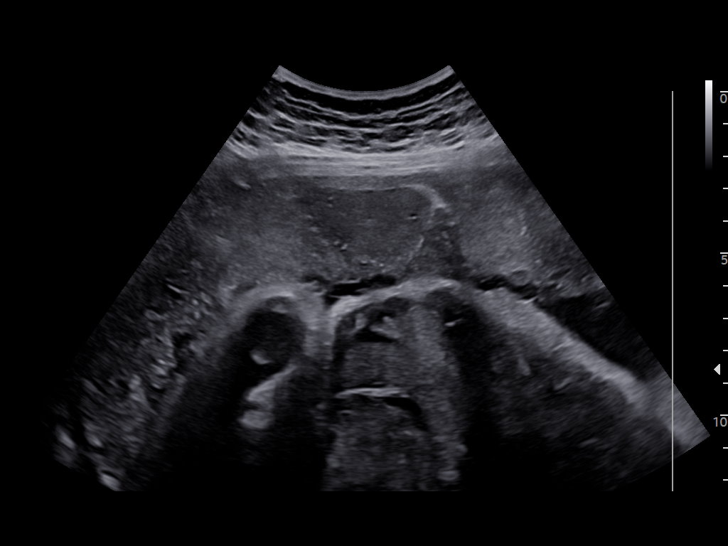
[im 21/51]
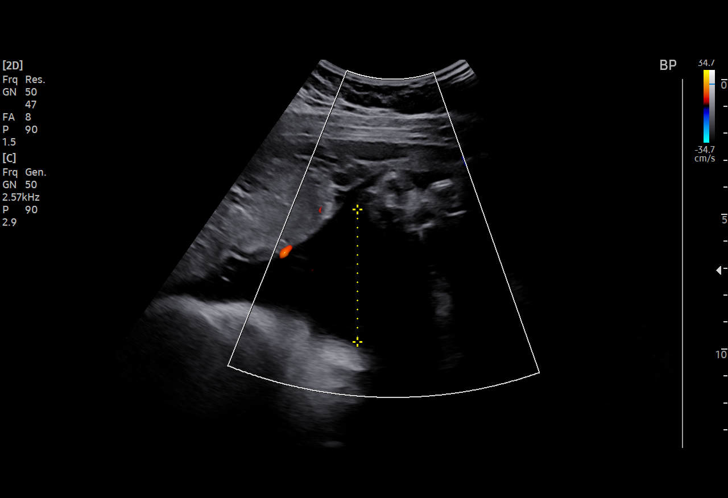
[im 26/51]
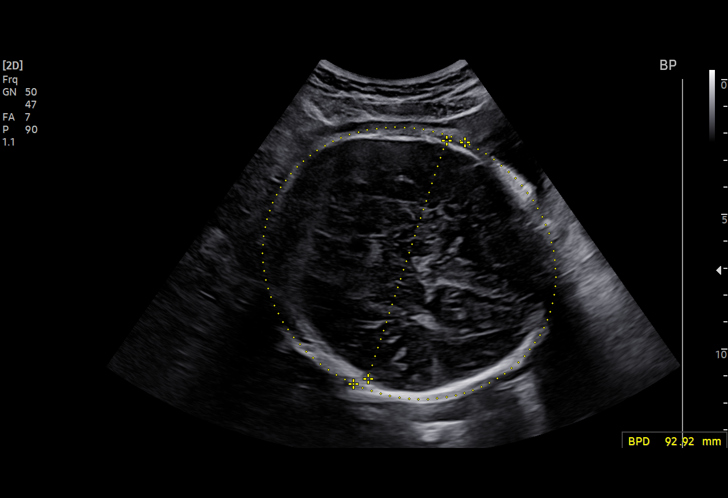
[im 30/51]
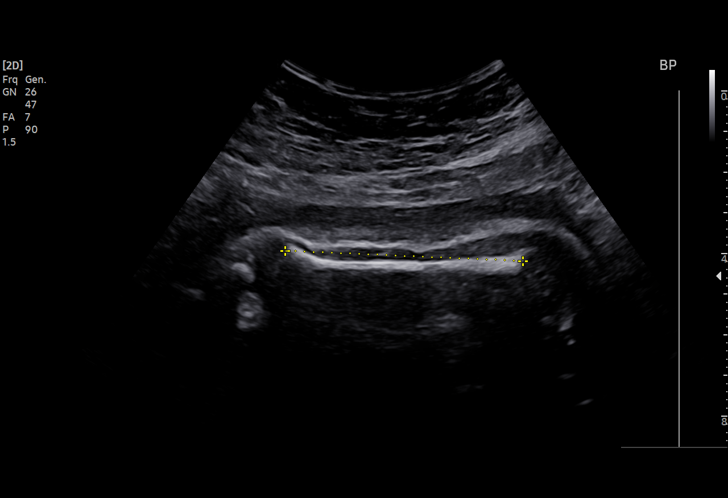
[im 34/51]
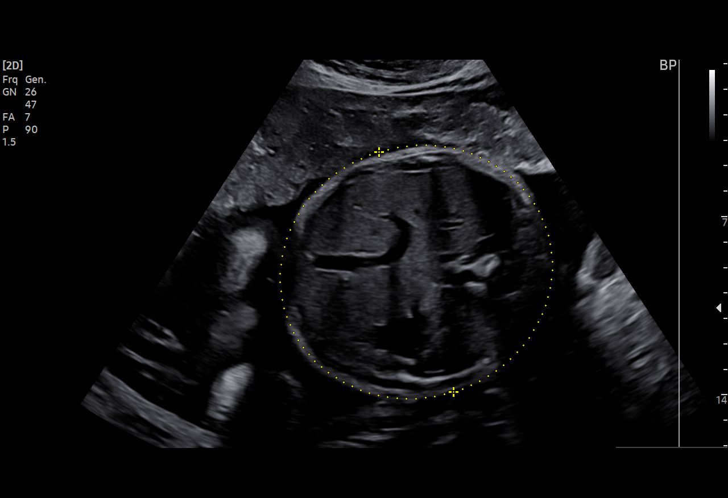
[im 38/51]
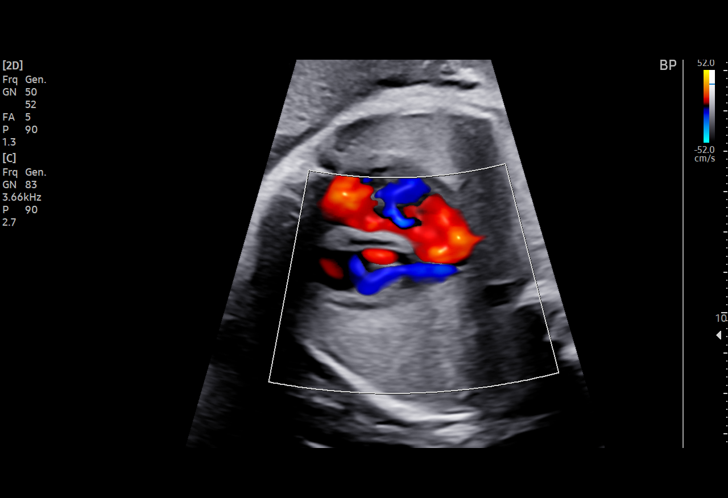
[im 41/51]
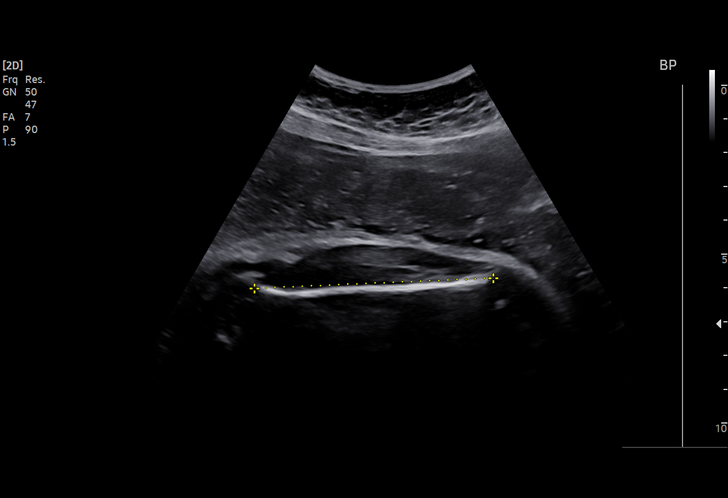
[im 45/51]
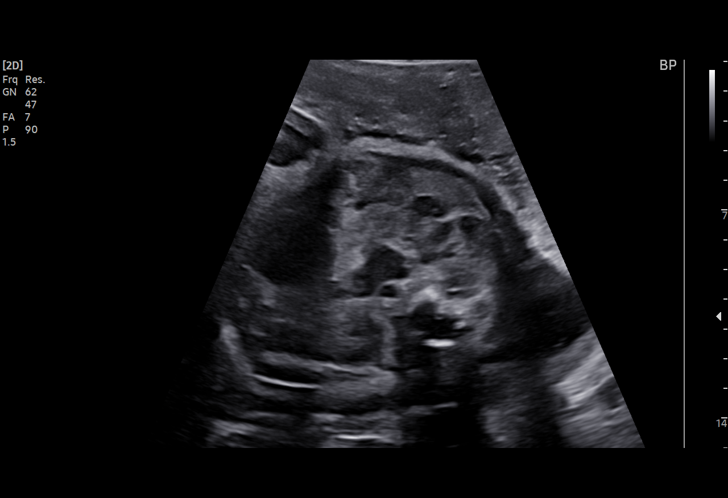
[im 49/51]
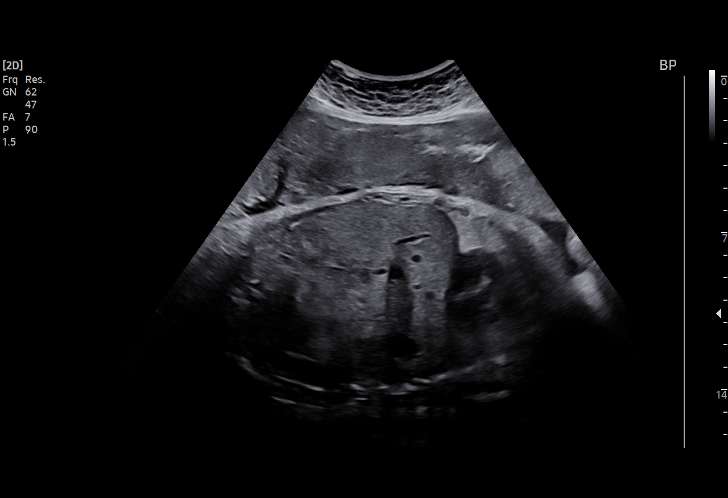

[13 of 28 positions shown; findings below may reference images not displayed]

Indications

 Abnormal fetal ultrasound- VSD
 Poor obstetric history: gestational HTN
 37 weeks gestation of pregnancy
 LR NIPS, Neg Horizon
Fetal Evaluation

 Num Of Fetuses:         1
 Fetal Heart Rate(bpm):  144
 Cardiac Activity:       Observed
 Presentation:           Cephalic
 Placenta:               Anterior
 P. Cord Insertion:      Previously Visualized

 Amniotic Fluid
 AFI FV:      Within normal limits

 AFI Sum(cm)     %Tile       Largest Pocket(cm)
 11.78           39

 RUQ(cm)       RLQ(cm)       LUQ(cm)        LLQ(cm)

Biometry

 BPD:     92.45  mm     G. Age:  37w 4d         67  %    CI:        78.88   %    70 - 86
                                                         FL/HC:      20.9   %    20.9 -
 HC:    329.16   mm     G. Age:  37w 3d         22  %    HC/AC:      1.02        0.92 -
 AC:    323.82   mm     G. Age:  36w 2d         26  %    FL/BPD:     74.4   %    71 - 87
 FL:      68.77  mm     G. Age:  35w 2d          5  %    FL/AC:      21.2   %    20 - 24
 HUM:      58.3  mm     G. Age:  33w 5d        < 5  %
 LV:        2.8  mm
 Est. FW:    3948  gm      6 lb 6 oz     25  %
OB History

 Gravidity:    2         Term:   1        Prem:   0        SAB:   0
 TOP:          0       Ectopic:  0        Living: 1
Gestational Age

 LMP:           37w 5d        Date:  06/19/21                  EDD:   03/26/22
 U/S Today:     36w 5d                                        EDD:   04/02/22
 Best:          37w 5d     Det. By:  LMP  (06/19/21)          EDD:   03/26/22
Anatomy

 Cranium:               Appears normal         LVOT:                   Appears normal
 Cavum:                 Appears normal         Aortic Arch:            Previously seen
 Ventricles:            Appears normal         Ductal Arch:            Previously seen
 Choroid Plexus:        Previously seen        Diaphragm:              Appears normal
 Cerebellum:            Previously seen        Stomach:                Appears normal, left
                                                                       sided
 Posterior Fossa:       Previously seen        Abdomen:                Previously seen
 Nuchal Fold:           Previously seen        Abdominal Wall:         Previously seen
 Face:                  Orbits and profile     Cord Vessels:           Previously seen
                        previously seen
 Lips:                  Previously seen        Kidneys:                Appear normal
 Palate:                Previously seen        Bladder:                Appears normal
 Thoracic:              Previously seen        Spine:                  Previously seen
 Heart:                 Appears normal         Upper Extremities:      Previously seen
                        (4CH, axis, and
                        situs)
 RVOT:                  Appears normal         Lower Extremities:      Previously seen

 Other:  3VTV visualized. VC, 3VV and 3VTV previously visualized. Male
         gender previously seen. Technically difficult due to fetal position.
Cervix Uterus Adnexa

 Cervix
 Not visualized (advanced GA >38wks)

 Uterus
 Normal shape and size.

 Right Ovary
 Not visualized.

 Left Ovary
 Not visualized.
Impression
 Follow up growth due to history of GHTN and suspected VSD
 in the previous exam (suspect spontaneous closure as it is
 not seen today).
 Normal interval growth with measurements consistent with
 dates
 Good fetal movement and amniotic fluid volume
Recommendations

 Follow up as clinically indicated.

## 2023-08-04 ENCOUNTER — Emergency Department: Payer: Medicaid Other

## 2023-08-04 ENCOUNTER — Encounter: Payer: Self-pay | Admitting: Intensive Care

## 2023-08-04 ENCOUNTER — Other Ambulatory Visit: Payer: Self-pay

## 2023-08-04 DIAGNOSIS — R079 Chest pain, unspecified: Secondary | ICD-10-CM | POA: Diagnosis present

## 2023-08-04 DIAGNOSIS — R200 Anesthesia of skin: Secondary | ICD-10-CM | POA: Diagnosis not present

## 2023-08-04 LAB — CBC
HCT: 39 % (ref 36.0–46.0)
Hemoglobin: 13.1 g/dL (ref 12.0–15.0)
MCH: 29.6 pg (ref 26.0–34.0)
MCHC: 33.6 g/dL (ref 30.0–36.0)
MCV: 88 fL (ref 80.0–100.0)
Platelets: 205 10*3/uL (ref 150–400)
RBC: 4.43 MIL/uL (ref 3.87–5.11)
RDW: 11.8 % (ref 11.5–15.5)
WBC: 5.3 10*3/uL (ref 4.0–10.5)
nRBC: 0 % (ref 0.0–0.2)

## 2023-08-04 LAB — TROPONIN I (HIGH SENSITIVITY): Troponin I (High Sensitivity): <2 ng/L (ref <18)

## 2023-08-04 LAB — BASIC METABOLIC PANEL
Anion gap: 8 (ref 5–15)
BUN: 10 mg/dL (ref 6–20)
CO2: 22 mmol/L (ref 22–32)
Calcium: 9.2 mg/dL (ref 8.9–10.3)
Chloride: 107 mmol/L (ref 98–111)
Creatinine, Ser: 0.65 mg/dL (ref 0.44–1.00)
GFR, Estimated: >60 mL/min (ref >60)
Glucose, Bld: 82 mg/dL (ref 70–99)
Potassium: 4.2 mmol/L (ref 3.5–5.1)
Sodium: 137 mmol/L (ref 135–145)

## 2023-08-04 NOTE — ED Triage Notes (Signed)
Patient reports left sided back pain that radiates to central chest and then down left arm. Reports it feels like a numbness

## 2023-08-05 ENCOUNTER — Emergency Department
Admission: EM | Admit: 2023-08-05 | Discharge: 2023-08-05 | Disposition: A | Discharge status: Home / Self Care | Payer: Medicaid Other | Attending: Emergency Medicine | Admitting: Emergency Medicine

## 2023-08-05 DIAGNOSIS — R079 Chest pain, unspecified: Secondary | ICD-10-CM

## 2023-08-05 NOTE — ED Provider Notes (Signed)
[facility] Provider Note    Event Date/Time   First MD Initiated Contact with Patient 08/05/23 0012     (approximate)   History   Chest Pain and Numbness   HPI  Gabrielle Fernandez is a 27 y.o. female   Past medical history of no significant past medical history who presents to the emergency department with intermittent left-sided chest pain associated left arm numbness.  Happening occasionally throughout the day started this morning, with no obvious exacerbating or alleviating symptoms.  Not exertional.    No respiratory symptoms, no leg symptoms, no history of blood clots immobilizations or oral contraceptive use.   Independent Historian contributed to assessment above: Family members at bedside to corroborate information and past medical history as above       Physical Exam   Triage Vital Signs: ED Triage Vitals  Encounter Vitals Group     BP 08/04/23 1830 126/85     Systolic BP Percentile --      Diastolic BP Percentile --      Pulse Rate 08/04/23 1830 82     Resp 08/04/23 1830 17     Temp 08/04/23 1830 98.3 F (36.8 C)     Temp Source 08/04/23 1830 Oral     SpO2 08/04/23 1830 100 %     Weight 08/04/23 1830 158 lb (71.7 kg)     Height 08/04/23 1830 5\' 7"  (1.702 m)     Head Circumference --      Peak Flow --      Pain Score 08/04/23 1833 4     Pain Loc --      Pain Education --      Exclude from Growth Chart --     Most recent vital signs: Vitals:   08/05/23 0012 08/05/23 0124  BP: (!) 141/84 117/71  Pulse: 90 84  Resp: 16 14  Temp: 98 F (36.7 C)   SpO2: 96% 100%    General: Awake, no distress.  CV:  Good peripheral perfusion.  Resp:  Normal effort.  Abd:  No distention.  Other:  Clear lungs, normal heart sounds, soft nontender abdomen, appears euvolemic and comfortable.  Normal vital signs.   ED Results / Procedures / Treatments   Labs (all labs ordered are listed, but only abnormal results are displayed) Labs  Reviewed  BASIC METABOLIC PANEL  CBC  PROTIME-INR  POC URINE PREG, ED  TROPONIN I (HIGH SENSITIVITY)  TROPONIN I (HIGH SENSITIVITY)     I ordered and reviewed the above labs they are notable for cell counts, electrolytes, troponin negative.  EKG  ED ECG REPORT I, Pilar Jarvis, the attending physician, personally viewed and interpreted this ECG.   Date: 08/05/2023  EKG Time: 1834  Rate: 83  Rhythm: sinus  Axis: nl  Intervals:none  ST&T Change: no stemi    RADIOLOGY I independently reviewed and interpreted chest x-ray and I see no obvious focality or pneumothorax I also reviewed radiologist's formal read.   PROCEDURES:  Critical Care performed: No  Procedures   MEDICATIONS ORDERED IN ED: Medications - No data to display  IMPRESSION / MDM / ASSESSMENT AND PLAN / ED COURSE  I reviewed the triage vital signs and the nursing notes.                                Patient's presentation is most consistent with acute presentation with potential threat to  life or bodily function.  Differential diagnosis includes, but is not limited to, ACS, dissection, PE, costochondritis, pneumothorax, musculoskeletal pain   MDM:    Unspecific chest pain intermittent left-sided with arm involvement.  Very low risk for ACS given no cardiac risk factors young age, EKG nonischemic and troponin negative, defer second troponin given the time course of her illness and low clinical suspicion ACS.  Doubt PE, Wells low risk, PERC negative.  Doubt dissection as this does not match with her symptomatology.  Chest x-ray looks normal.  Given nonspecific symptoms and unremarkable workup as above, I doubt cardiopulmonary emergency at this time, anticipatory guidance given and she will follow-up with PMD.      FINAL CLINICAL IMPRESSION(S) / ED DIAGNOSES   Final diagnoses:  Nonspecific chest pain     Rx / DC Orders   ED Discharge Orders     None        Note:  This document was  prepared using Dragon voice recognition software and may include unintentional dictation errors.    Pilar Jarvis, MD 08/05/23 [phone]

## 2023-08-05 NOTE — ED Notes (Signed)
Pt ambulatory to room and in no acute distress.

## 2023-08-05 NOTE — Discharge Instructions (Addendum)
Fortunately your testing in the emergency department did not show any signs of emergency conditions that account for your symptoms.  Take acetaminophen 650 mg and ibuprofen 400 mg every 6 hours for pain.  Take with food.   Thank you for choosing [place] for your health care today!  Please see your primary doctor this week for a follow up appointment.   If you have any new, worsening, or unexpected symptoms call your doctor right away or come back to the emergency department for reevaluation.  It was my pleasure to care for you today.   Daneil Dan Modesto Charon, MD

## 2024-03-14 ENCOUNTER — Emergency Department: Payer: Self-pay

## 2024-03-14 ENCOUNTER — Other Ambulatory Visit: Payer: Self-pay

## 2024-03-14 ENCOUNTER — Emergency Department
Admission: EM | Admit: 2024-03-14 | Discharge: 2024-03-14 | Disposition: A | Discharge status: Home / Self Care | Payer: Self-pay | Attending: Emergency Medicine | Admitting: Emergency Medicine

## 2024-03-14 DIAGNOSIS — R1030 Lower abdominal pain, unspecified: Secondary | ICD-10-CM

## 2024-03-14 DIAGNOSIS — R1032 Left lower quadrant pain: Secondary | ICD-10-CM | POA: Insufficient documentation

## 2024-03-14 DIAGNOSIS — R112 Nausea with vomiting, unspecified: Secondary | ICD-10-CM | POA: Insufficient documentation

## 2024-03-14 DIAGNOSIS — R519 Headache, unspecified: Secondary | ICD-10-CM | POA: Insufficient documentation

## 2024-03-14 LAB — URINALYSIS, ROUTINE W REFLEX MICROSCOPIC
Bilirubin Urine: NEGATIVE
Glucose, UA: NEGATIVE mg/dL
Hgb urine dipstick: NEGATIVE
Ketones, ur: NEGATIVE mg/dL
Leukocytes,Ua: NEGATIVE
Nitrite: NEGATIVE
Protein, ur: NEGATIVE mg/dL
Specific Gravity, Urine: 1.005 (ref 1.005–1.030)
pH: 6 (ref 5.0–8.0)

## 2024-03-14 LAB — CBC
HCT: 38.2 % (ref 36.0–46.0)
Hemoglobin: 13.2 g/dL (ref 12.0–15.0)
MCH: 30.9 pg (ref 26.0–34.0)
MCHC: 34.6 g/dL (ref 30.0–36.0)
MCV: 89.5 fL (ref 80.0–100.0)
Platelets: 216 10*3/uL (ref 150–400)
RBC: 4.27 MIL/uL (ref 3.87–5.11)
RDW: 11.3 % — ABNORMAL LOW (ref 11.5–15.5)
WBC: 5.3 10*3/uL (ref 4.0–10.5)
nRBC: 0 % (ref 0.0–0.2)

## 2024-03-14 LAB — COMPREHENSIVE METABOLIC PANEL WITH GFR
ALT: 17 U/L (ref 0–44)
AST: 16 U/L (ref 15–41)
Albumin: 4.3 g/dL (ref 3.5–5.0)
Alkaline Phosphatase: 37 U/L — ABNORMAL LOW (ref 38–126)
Anion gap: 8 (ref 5–15)
BUN: 11 mg/dL (ref 6–20)
CO2: 24 mmol/L (ref 22–32)
Calcium: 9.2 mg/dL (ref 8.9–10.3)
Chloride: 106 mmol/L (ref 98–111)
Creatinine, Ser: 0.57 mg/dL (ref 0.44–1.00)
GFR, Estimated: >60 mL/min (ref >60)
Glucose, Bld: 109 mg/dL — ABNORMAL HIGH (ref 70–99)
Potassium: 3.5 mmol/L (ref 3.5–5.1)
Sodium: 138 mmol/L (ref 135–145)
Total Bilirubin: 0.8 mg/dL (ref 0.0–1.2)
Total Protein: 7.7 g/dL (ref 6.5–8.1)

## 2024-03-14 LAB — PREGNANCY, URINE: Preg Test, Ur: NEGATIVE

## 2024-03-14 LAB — LIPASE, BLOOD: Lipase: 29 U/L (ref 11–51)

## 2024-03-14 MED ORDER — ONDANSETRON HCL 4 MG PO TABS: 4.0000 mg | ORAL_TABLET | Freq: Four times a day (QID) | ORAL | 0 refills | Status: AC | PRN

## 2024-03-14 MED ORDER — DICYCLOMINE HCL 20 MG PO TABS
20.0000 mg | ORAL_TABLET | Freq: Once | ORAL | Status: AC
  Administered 2024-03-14: 20 mg via ORAL
  Filled 2024-03-14 (×2): qty 1

## 2024-03-14 MED ORDER — SODIUM CHLORIDE 0.9 % IV BOLUS
1000.0000 mL | Freq: Once | INTRAVENOUS | Status: AC
  Administered 2024-03-14: 1000 mL via INTRAVENOUS

## 2024-03-14 MED ORDER — KETOROLAC TROMETHAMINE 15 MG/ML IJ SOLN
15.0000 mg | Freq: Once | INTRAMUSCULAR | Status: AC
  Administered 2024-03-14: 15 mg via INTRAVENOUS
  Filled 2024-03-14: qty 1

## 2024-03-14 MED ORDER — ONDANSETRON HCL 4 MG/2ML IJ SOLN
4.0000 mg | Freq: Once | INTRAMUSCULAR | Status: AC
Start: 2024-03-14 — End: 2024-03-14
  Administered 2024-03-14: 4 mg via INTRAVENOUS
  Filled 2024-03-14: qty 2

## 2024-03-14 MED ORDER — MORPHINE SULFATE (PF) 4 MG/ML IV SOLN
4.0000 mg | Freq: Once | INTRAVENOUS | Status: AC
  Administered 2024-03-14: 4 mg via INTRAVENOUS
  Filled 2024-03-14: qty 1

## 2024-03-14 MED ORDER — IOHEXOL 300 MG/ML  SOLN
100.0000 mL | Freq: Once | INTRAMUSCULAR | Status: AC | PRN
  Administered 2024-03-14: 100 mL via INTRAVENOUS

## 2024-03-14 MED ORDER — DICYCLOMINE HCL 20 MG PO TABS: 20.0000 mg | ORAL_TABLET | Freq: Four times a day (QID) | ORAL | 0 refills | Status: AC | PRN

## 2024-03-14 NOTE — Discharge Instructions (Signed)
 You were seen in the Emergency Department today for evaluation of your abdominal pain. Fortunately, your labs, urine test, and CT scan were overall reassuring against an emergency cause for your pain. Please follow-up with your primary care doctor within the next few days for reevaluation. Return to the ER for any new or worsening symptoms including worsening pain, inability to tolerate food or liquids, or any other new or concerning symptoms.  I sent a prescription for an nausea and anticraving medicine to your pharmacy that you can take as needed.

## 2024-03-14 NOTE — ED Triage Notes (Signed)
 Pt here with lower abd pain. Pt states the pain started on the left 5 days ago and now it is her entire abd and radiates to her back. Pt also has a migraine x3. Pt also c/o N/V and abd distention.

## 2024-03-14 NOTE — ED Provider Notes (Signed)
 [facility] Provider Note    Event Date/Time   First MD Initiated Contact with Patient 03/14/24 1701     (approximate)   History   Abdominal Pain   HPI  Gabrielle Fernandez is a 28 year old female presenting to the emergency department for evaluation of abdominal pain and vomiting.  Patient reports that over the past 5 days she has had intermittent episodes of abdominal pain with radiation to her back with associated nonbloody vomiting.  Having normal bowel movements.  Additionally reports a headache, not sudden in onset.  No associated numbness, tingling, focal weakness.    Physical Exam   Triage Vital Signs: ED Triage Vitals  Encounter Vitals Group     BP 03/14/24 1446 (!) 137/105     Systolic BP Percentile --      Diastolic BP Percentile --      Pulse Rate 03/14/24 1446 93     Resp 03/14/24 1446 17     Temp 03/14/24 1446 98.4 F (36.9 C)     Temp Source 03/14/24 1446 Oral     SpO2 03/14/24 1446 100 %     Weight 03/14/24 1447 158 lb 1.1 oz (71.7 kg)     Height 03/14/24 1447 5\' 7"  (1.702 m)     Head Circumference --      Peak Flow --      Pain Score 03/14/24 1447 10     Pain Loc --      Pain Education --      Exclude from Growth Chart --     Most recent vital signs: Vitals:   03/14/24 1446  BP: (!) 137/105  Pulse: 93  Resp: 17  Temp: 98.4 F (36.9 C)  SpO2: 100%     General: Awake, interactive  CV:  Regular rate, good peripheral perfusion.  Resp:  Unlabored respirations, lungs clear to auscultation Abd:  Nondistended, soft, tender to palpation most notably in the left lower quadrant without rebound or guarding Neuro:  Symmetric facial movement, fluid speech, 5 5 strength in the bilateral upper and lower extremities with normal sensation   ED Results / Procedures / Treatments   Labs (all labs ordered are listed, but only abnormal results are displayed) Labs Reviewed  COMPREHENSIVE METABOLIC PANEL WITH GFR - Abnormal; Notable  for the following components:      Result Value   Glucose, Bld 109 (*)    Alkaline Phosphatase 37 (*)    All other components within normal limits  CBC - Abnormal; Notable for the following components:   RDW 11.3 (*)    All other components within normal limits  URINALYSIS, ROUTINE W REFLEX MICROSCOPIC - Abnormal; Notable for the following components:   Color, Urine STRAW (*)    APPearance CLEAR (*)    All other components within normal limits  LIPASE, BLOOD  PREGNANCY, URINE     EKG EKG independently reviewed and interpreted by myself demonstrates:    RADIOLOGY Imaging independently reviewed and interpreted by myself demonstrates:  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  Discussed with patient, she is aware of this anatomical variant and has seen urology for this in the past.  Formal Radiology Read:  CT ABDOMEN PELVIS W CONTRAST Result Date: 03/14/2024 CLINICAL DATA:  Left lower quadrant abdominal pain radiating to the back. Nausea, vomiting, abdominal distension EXAM: CT ABDOMEN AND PELVIS WITH CONTRAST TECHNIQUE: Multidetector CT imaging of the abdomen and pelvis was performed using the  standard protocol following bolus administration of intravenous contrast. RADIATION DOSE REDUCTION: This exam was performed according to the departmental dose-optimization program which includes automated exposure control, adjustment of the mA and/or kV according to patient size and/or use of iterative reconstruction technique. CONTRAST:  OMNIPAQUE IOHEXOL 300 MG/ML  SOLN COMPARISON:  CT abdomen pelvis 12/14/2017 FINDINGS: Lower chest: No acute abnormality. Hepatobiliary: Unremarkable liver. Normal gallbladder. No biliary dilation. Pancreas: Unremarkable. Spleen: Unremarkable. Adrenals/Urinary Tract: Normal adrenal glands. 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 Moderate hydroureter of the distal left ureter  measuring up to 15 mm in diameter upstream from a left ureterocele (series 5/image 48). Bladder is unremarkable. Stomach/Bowel: Normal caliber large and small bowel. No bowel wall thickening. The appendix is not visualized.Stomach is within normal limits. Vascular/Lymphatic: No significant vascular findings are present. No enlarged abdominal or pelvic lymph nodes. Reproductive: IUD in the uterus.  No adnexal mass. Other: No free intraperitoneal fluid or air. Musculoskeletal: No acute fracture. IMPRESSION: No acute abnormality. 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 Electronically Signed   By: Rozell Cornet M.D.   On: 03/14/2024 19:37    PROCEDURES:  Critical Care performed: No  Procedures   MEDICATIONS ORDERED IN ED: Medications  ketorolac  (TORADOL ) 15 MG/ML injection 15 mg (has no administration in time range)  dicyclomine  (BENTYL ) tablet 20 mg (has no administration in time range)  ondansetron  (ZOFRAN ) injection 4 mg (4 mg Intravenous Given 03/14/24 1815)  morphine  (PF) 4 MG/ML injection 4 mg (4 mg Intravenous Given 03/14/24 1817)  sodium chloride  0.9 % bolus 1,000 mL (1,000 mLs Intravenous New Bag/Given 03/14/24 1813)  iohexol (OMNIPAQUE) 300 MG/ML solution 100 mL (100 mLs Intravenous Contrast Given 03/14/24 1909)     IMPRESSION / MDM / ASSESSMENT AND PLAN / ED COURSE  I reviewed the triage vital signs and the nursing notes.  Differential diagnosis includes, but is not limited to, diverticulitis, ovarian cyst rupture, other intra-abdominal process, viral illness  Patient's presentation is most consistent with acute presentation with potential threat to life or bodily function.  28 year old female presenting with abdominal pain and vomiting.  Stable vitals on presentation.  Labs with normal white blood cell count and hemoglobin.  CMP without critical  derangements.  Urine without evidence of infection.  Normal lipase.  UPT negative.  Given isolated left lower quadrant pain, CT obtained.  This did demonstrate a duplicating collecting system which patient was previously aware of.  Her creatinine is at baseline and her urine is without evidence of infection, no acute complication appreciable from this.  Patient did not report significant improvement in the pain with morphine .  Will trial Toradol  and Bentyl .  Patient is comfortable with discharge home and outpatient follow-up.  Strict return precautions provided.  Will DC with prescriptions for Zofran  and Bentyl .      FINAL CLINICAL IMPRESSION(S) / ED DIAGNOSES   Final diagnoses:  Lower abdominal pain     Rx / DC Orders   ED Discharge Orders          Ordered    dicyclomine  (BENTYL ) 20 MG tablet  Every 6 hours PRN        03/14/24 2012    ondansetron  (ZOFRAN ) 4 MG tablet  Every 6 hours PRN        03/14/24 2012             Note:  This document was  prepared using Conservation officer, historic buildings and may include unintentional dictation errors.   Claria Crofts, MD 03/14/24 2012
